# Patient Record
Sex: Female | Born: 1985
Health system: Southern US, Community
[De-identification: ages and names within clinical notes are randomized; demographics above are authoritative.]

## PROBLEM LIST (undated history)

## (undated) DIAGNOSIS — B977 Papillomavirus as the cause of diseases classified elsewhere: Secondary | ICD-10-CM

## (undated) HISTORY — DX: Papillomavirus as the cause of diseases classified elsewhere: B97.7

## (undated) HISTORY — PX: DILATION AND CURETTAGE OF UTERUS: SHX78

## (undated) HISTORY — PX: CERVICAL BIOPSY  W/ LOOP ELECTRODE EXCISION: SUR135

---

## 2006-09-26 ENCOUNTER — Inpatient Hospital Stay: Payer: Self-pay | Admitting: Obstetrics and Gynecology

## 2015-04-30 DIAGNOSIS — B977 Papillomavirus as the cause of diseases classified elsewhere: Secondary | ICD-10-CM

## 2015-04-30 HISTORY — DX: Papillomavirus as the cause of diseases classified elsewhere: B97.7

## 2015-04-30 LAB — HM PAP SMEAR

## 2016-11-10 ENCOUNTER — Encounter: Payer: Self-pay | Admitting: Certified Nurse Midwife

## 2016-11-10 ENCOUNTER — Ambulatory Visit (INDEPENDENT_AMBULATORY_CARE_PROVIDER_SITE_OTHER): Payer: 59 | Admitting: Certified Nurse Midwife

## 2016-11-10 VITALS — BP 112/85 | HR 101 | Ht 63.0 in | Wt 182.0 lb

## 2016-11-10 DIAGNOSIS — Z3041 Encounter for surveillance of contraceptive pills: Secondary | ICD-10-CM

## 2016-11-10 DIAGNOSIS — Z124 Encounter for screening for malignant neoplasm of cervix: Secondary | ICD-10-CM | POA: Diagnosis not present

## 2016-11-10 DIAGNOSIS — Z01419 Encounter for gynecological examination (general) (routine) without abnormal findings: Secondary | ICD-10-CM

## 2016-11-10 MED ORDER — NORGESTIM-ETH ESTRAD TRIPHASIC 0.18/0.215/0.25 MG-25 MCG PO TABS: 1.0000 | ORAL_TABLET | Freq: Every day | ORAL | 3 refills | Status: DC

## 2016-11-10 NOTE — Progress Notes (Signed)
ANNUAL PREVENTATIVE CARE GYN  ENCOUNTER NOTE  Subjective:       Ann Shepherd is a 31 y.o. [id number] female here for a routine annual gynecologic exam.    Denies difficulty breathing or respiratory distress, chest pain, abdominal pain, dysuria, unexplained vaginal bleeding, change in vaginal discharge, and leg pain or swelling.    Gynecologic History  Patient's last menstrual period was 10/26/2016 (exact date).  Contraception: OCP (estrogen/progesterone)  Last Pap: 04/2015. Results were: normal; HPV positive.   Menstrual History  Period Cycle (Days): 28 Period Duration (Days): 5-7 Period Pattern: Regular Menstrual Flow: Moderate Menstrual Control: Maxi pad Menstrual Control Change Freq (Hours): 1-2 hours on heavy days Dysmenorrhea: None  Obstetric History  OB History  Gravida Para Term Preterm AB Living  3 2 2   1 2   SAB TAB Ectopic Multiple Live Births  1       2    # Outcome Date GA Lbr Len/2nd Weight Sex Delivery Anes PTL Lv  3 Term 2012 [redacted]w[redacted]d  6 lb 3.2 oz (2.812 kg) F Vag-Spont   LIV  2 SAB 2010 [redacted]w[redacted]d       ND  1 Term 2008 [redacted]w[redacted]d  5 lb 9.6 oz (2.54 kg) F Vag-Spont  N LIV    Obstetric Comments  2010-sab & D&C    Past Medical History:  Diagnosis Date  . 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 DILATION AND CURETTAGE OF UTERUS      No current outpatient prescriptions on file prior to visit.   No current facility-administered medications on file prior to visit.     No Known Allergies  Social History   Social History  . Marital status: Single    Spouse name: N/A  . Number of children: N/A  . Years of education: N/A   Occupational History  . Not on file.   Social History Main Topics  . Smoking status: Never Smoker  . Smokeless tobacco: Never Used  . Alcohol use No  . Drug use: No  . Sexual activity: Yes    Partners: Male    Birth control/ protection: OCP   Other Topics Concern  . Not on file   Social  History Narrative  . No narrative on file    Family History  Problem Relation Age of Onset  . Diabetes Mother     The following portions of the patient's history were reviewed and updated as appropriate: allergies, current medications, past family history, past medical history, past social history, past surgical history and problem list.  Review of Systems  ROS negative expect as noted above. Information obtained from patient.    Objective:   BP 112/85   Pulse (!) 101   Ht 5\' 3"  (1.6 m)   Wt 182 lb (82.6 kg)   LMP 10/26/2016 (Exact Date)   BMI 32.24 kg/m   CONSTITUTIONAL: Well-developed, well-nourished female in no acute distress.   PSYCHIATRIC: Normal mood and affect. Normal behavior. Normal judgment and thought content.  NEUROLGIC: Alert and oriented to person, place, and time. Normal muscle tone coordination. No cranial nerve deficit noted.  HENT:  Normocephalic, atraumatic, External right and left ear normal. Oropharynx is clear and moist  EYES: Conjunctivae and EOM are normal. Pupils are equal, round, and reactive to light. No scleral icterus.   NECK: Normal range of motion, supple, no masses.  Normal thyroid.   SKIN: Skin is warm and dry. No rash noted. Not  diaphoretic. No erythema. No pallor.  CARDIOVASCULAR: Normal heart rate noted, regular rhythm, no murmur.  RESPIRATORY: Clear to auscultation bilaterally. Effort and breath sounds normal, no problems with respiration noted.  BREASTS: Symmetric in size. No masses, skin changes, nipple drainage, or lymphadenopathy.  ABDOMEN: Soft, normal bowel sounds, no distention noted.  No tenderness, rebound or guarding.   PELVIC:  External Genitalia: Normal  BUS: Normal  Vagina: Normal  Cervix: Normal  Uterus: Normal  Adnexa: Normal   MUSCULOSKELETAL: Normal range of motion. No tenderness.  No cyanosis, clubbing, or edema.  2+ distal pulses.  LYMPHATIC: No Axillary, Supraclavicular, or Inguinal  Adenopathy.  Assessment:   Annual gynecologic examination 31 y.o.   Contraception: OCP (estrogen/progesterone)   Obesity 1   Problem List Items Addressed This Visit    None    Visit Diagnoses    Well woman exam with routine gynecological exam    -  Primary   Relevant Orders   Pap IG and HPV (high risk) DNA detection   Screening for cervical cancer       Relevant Orders   Pap IG and HPV (high risk) DNA detection   Encounter for surveillance of contraceptive pills          Plan:   Pap: Pap Co Test.   Labs: Declined.   Routine preventative health maintenance measures emphasized: Exercise/Diet/Weight control, Tobacco Warnings, Alcohol/Substance use risks and Stress Management.   Rx Ortho Tri Cyclen Lo, see orders.   RTC x 1 year for annual exam or sooner if needed.    Gunnar BullaJenkins Michelle Nemesis Rainwater, CNM

## 2016-11-10 NOTE — Patient Instructions (Signed)
Ethinyl Estradiol; Norgestimate tablets What is this medicine? ETHINYL ESTRADIOL; NORGESTIMATE (ETH in il es tra DYE ole; nor JES ti mate) is an oral contraceptive. The products combine two types of female hormones, an estrogen and a progestin. They are used to prevent ovulation and pregnancy. Some products are also used to treat acne in females. This medicine may be used for other purposes; ask your health care provider or pharmacist if you have questions. COMMON BRAND NAME(S): Estarylla, MONO-LINYAH, MonoNessa, Norgestimate/Ethinyl Estradiol, Ortho Tri-Cyclen, Ortho Tri-Cyclen Lo, Ortho-Cyclen, Previfem, Sprintec, Tri-Estarylla, TRI-LINYAH, Tri-Lo-Estarylla, Tri-Lo-Marzia, Tri-Lo-Sprintec, Tri-Previfem, Tri-Sprintec, Tri-VyLibra, Trinessa, Minerva Areola What should I tell my health care provider before I take this medicine? They need to know if you have or ever had any of these conditions: -abnormal vaginal bleeding -blood vessel disease or blood clots -breast, cervical, endometrial, ovarian, liver, or uterine cancer -diabetes -gallbladder disease -heart disease or recent heart attack -high blood pressure -high cholesterol -kidney disease -liver disease -migraine headaches -stroke -systemic lupus erythematosus (SLE) -tobacco smoker -an unusual or allergic reaction to estrogens, progestins, other medicines, foods, dyes, or preservatives -pregnant or trying to get pregnant -breast-feeding How should I use this medicine? Take this medicine by mouth. To reduce nausea, this medicine may be taken with food. Follow the directions on the prescription label. Take this medicine at the same time each day and in the order directed on the package. Do not take your medicine more often than directed. Contact your pediatrician regarding the use of this medicine in children. Special care may be needed. This medicine has been used in female children who have started having menstrual periods. A  patient package insert for the product will be given with each prescription and refill. Read this sheet carefully each time. The sheet may change frequently. Overdosage: If you think you have taken too much of this medicine contact a poison control center or emergency room at once. NOTE: This medicine is only for you. Do not share this medicine with others. What if I miss a dose? If you miss a dose, refer to the patient information sheet you received with your medicine for direction. If you miss more than one pill, this medicine may not be as effective and you may need to use another form of birth control. What may interact with this medicine? Do not take this medicine with the following medication: -dasabuvir; ombitasvir; paritaprevir; ritonavir -ombitasvir; paritaprevir; ritonavir This medicine may also interact with the following medications: -acetaminophen -antibiotics or medicines for infections, especially rifampin, rifabutin, rifapentine, and griseofulvin, and possibly penicillins or tetracyclines -aprepitant -ascorbic acid (vitamin C) -atorvastatin -barbiturate medicines, such as phenobarbital -bosentan -carbamazepine -caffeine -clofibrate -cyclosporine -dantrolene -doxercalciferol -felbamate -grapefruit juice -hydrocortisone -medicines for anxiety or sleeping problems, such as diazepam or temazepam -medicines for diabetes, including pioglitazone -mineral oil -modafinil -mycophenolate -nefazodone -oxcarbazepine -phenytoin -prednisolone -ritonavir or other medicines for HIV infection or AIDS -rosuvastatin -selegiline -soy isoflavones supplements -St. John's wort -tamoxifen or raloxifene -theophylline -thyroid hormones -topiramate -warfarin This list may not describe all possible interactions. Give your health care provider a list of all the medicines, herbs, non-prescription drugs, or dietary supplements you use. Also tell them if you smoke, drink alcohol, or use  illegal drugs. Some items may interact with your medicine. What should I watch for while using this medicine? Visit your doctor or health care professional for regular checks on your progress. You will need a regular breast and pelvic exam and Pap smear while on this medicine. You should also discuss  the need for regular mammograms with your health care professional, and follow his or her guidelines for these tests. This medicine can make your body retain fluid, making your fingers, hands, or ankles swell. Your blood pressure can go up. Contact your doctor or health care professional if you feel you are retaining fluid. Use an additional method of contraception during the first cycle that you take these tablets. If you have any reason to think you are pregnant, stop taking this medicine right away and contact your doctor or health care professional. If you are taking this medicine for hormone related problems, it may take several cycles of use to see improvement in your condition. Do not use this product if you smoke and are over 14 years of age. Smoking increases the risk of getting a blood clot or having a stroke while you are taking birth control pills, especially if you are more than 32 years old. If you are a smoker who is 2 years of age or younger, you are strongly advised not to smoke while taking birth control pills. This medicine can make you more sensitive to the sun. Keep out of the sun. If you cannot avoid being in the sun, wear protective clothing and use sunscreen. Do not use sun lamps or tanning beds/booths. If you wear contact lenses and notice visual changes, or if the lenses begin to feel uncomfortable, consult your eye care specialist. In some women, tenderness, swelling, or minor bleeding of the gums may occur. Notify your dentist if this happens. Brushing and flossing your teeth regularly may help limit this. See your dentist regularly and inform your dentist of the medicines you are  taking. If you are going to have elective surgery, you may need to stop taking this medicine before the surgery. Consult your health care professional for advice. This medicine does not protect you against HIV infection (AIDS) or any other sexually transmitted diseases. What side effects may I notice from receiving this medicine? Side effects that you should report to your doctor or health care professional as soon as possible: -breast tissue changes or discharge -changes in vaginal bleeding during your period or between your periods -chest pain -coughing up blood -dizziness or fainting spells -headaches or migraines -leg, arm or groin pain -severe or sudden headaches -stomach pain (severe) -sudden shortness of breath -sudden loss of coordination, especially on one side of the body -speech problems -symptoms of vaginal infection like itching, irritation or unusual discharge -tenderness in the upper abdomen -vomiting -weakness or numbness in the arms or legs, especially on one side of the body -yellowing of the eyes or skin Side effects that usually do not require medical attention (report to your doctor or health care professional if they continue or are bothersome): -breakthrough bleeding and spotting that continues beyond the 3 initial cycles of pills -breast tenderness -mood changes, anxiety, depression, frustration, anger, or emotional outbursts -increased sensitivity to sun or ultraviolet light -nausea -skin rash, acne, or brown spots on the skin -weight gain (slight) This list may not describe all possible side effects. Call your doctor for medical advice about side effects. You may report side effects to FDA at 1-800-FDA-1088. Where should I keep my medicine? Keep out of the reach of children. Store at room temperature between 15 and 30 degrees C (59 and 86 degrees F). Throw away any unused medicine after the expiration date. NOTE: This sheet is a summary. It may not cover  all possible information. If you have questions  about this medicine, talk to your doctor, pharmacist, or health care provider.  2018 Elsevier/Gold Standard (2016-01-25 08:09:09) Preventive Care 18-39 Years, Female Preventive care refers to lifestyle choices and visits with your health care provider that can promote health and wellness. What does preventive care include?  A yearly physical exam. This is also called an annual well check.  Dental exams once or twice a year.  Routine eye exams. Ask your health care provider how often you should have your eyes checked.  Personal lifestyle choices, including: ? Daily care of your teeth and gums. ? Regular physical activity. ? Eating a healthy diet. ? Avoiding tobacco and drug use. ? Limiting alcohol use. ? Practicing safe sex. ? Taking vitamin and mineral supplements as recommended by your health care provider. What happens during an annual well check? The services and screenings done by your health care provider during your annual well check will depend on your age, overall health, lifestyle risk factors, and family history of disease. Counseling Your health care provider may ask you questions about your:  Alcohol use.  Tobacco use.  Drug use.  Emotional well-being.  Home and relationship well-being.  Sexual activity.  Eating habits.  Work and work Statistician.  Method of birth control.  Menstrual cycle.  Pregnancy history.  Screening You may have the following tests or measurements:  Height, weight, and BMI.  Diabetes screening. This is done by checking your blood sugar (glucose) after you have not eaten for a while (fasting).  Blood pressure.  Lipid and cholesterol levels. These may be checked every 5 years starting at age 67.  Skin check.  Hepatitis C blood test.  Hepatitis B blood test.  Sexually transmitted disease (STD) testing.  BRCA-related cancer screening. This may be done if you have a family  history of breast, ovarian, tubal, or peritoneal cancers.  Pelvic exam and Pap test. This may be done every 3 years starting at age 92. Starting at age 97, this may be done every 5 years if you have a Pap test in combination with an HPV test.  Discuss your test results, treatment options, and if necessary, the need for more tests with your health care provider. Vaccines Your health care provider may recommend certain vaccines, such as:  Influenza vaccine. This is recommended every year.  Tetanus, diphtheria, and acellular pertussis (Tdap, Td) vaccine. You may need a Td booster every 10 years.  Varicella vaccine. You may need this if you have not been vaccinated.  HPV vaccine. If you are 94 or younger, you may need three doses over 6 months.  Measles, mumps, and rubella (MMR) vaccine. You may need at least one dose of MMR. You may also need a second dose.  Pneumococcal 13-valent conjugate (PCV13) vaccine. You may need this if you have certain conditions and were not previously vaccinated.  Pneumococcal polysaccharide (PPSV23) vaccine. You may need one or two doses if you smoke cigarettes or if you have certain conditions.  Meningococcal vaccine. One dose is recommended if you are age 68-21 years and a first-year college student living in a residence hall, or if you have one of several medical conditions. You may also need additional booster doses.  Hepatitis A vaccine. You may need this if you have certain conditions or if you travel or work in places where you may be exposed to hepatitis A.  Hepatitis B vaccine. You may need this if you have certain conditions or if you travel or work in places where you  may be exposed to hepatitis B.  Haemophilus influenzae type b (Hib) vaccine. You may need this if you have certain risk factors.  Talk to your health care provider about which screenings and vaccines you need and how often you need them. This information is not intended to replace  advice given to you by your health care provider. Make sure you discuss any questions you have with your health care provider. Document Released: 07/12/2001 Document Revised: 02/03/2016 Document Reviewed: 03/17/2015 Elsevier Interactive Patient Education  2017 [employer].

## 2016-11-12 LAB — PAP IG AND HPV HIGH-RISK
HPV, HIGH-RISK: POSITIVE — AB
PAP SMEAR COMMENT: 0

## 2016-11-14 ENCOUNTER — Other Ambulatory Visit: Payer: Self-pay

## 2016-11-14 DIAGNOSIS — R87618 Other abnormal cytological findings on specimens from cervix uteri: Secondary | ICD-10-CM

## 2016-11-14 DIAGNOSIS — R8789 Other abnormal findings in specimens from female genital organs: Secondary | ICD-10-CM

## 2016-11-24 LAB — SPECIMEN STATUS REPORT

## 2016-11-24 LAB — HPV GENOTYPES 16/18,45
HPV GENOTYPE 18,45: NEGATIVE
HPV Genotype 16: NEGATIVE

## 2017-08-10 DIAGNOSIS — Z719 Counseling, unspecified: Secondary | ICD-10-CM | POA: Diagnosis not present

## 2017-08-21 DIAGNOSIS — Z719 Counseling, unspecified: Secondary | ICD-10-CM | POA: Diagnosis not present

## 2017-11-06 ENCOUNTER — Other Ambulatory Visit: Payer: Self-pay | Admitting: Certified Nurse Midwife

## 2017-11-13 ENCOUNTER — Other Ambulatory Visit: Payer: Self-pay

## 2017-11-13 ENCOUNTER — Encounter: Payer: Self-pay | Admitting: Certified Nurse Midwife

## 2017-11-13 ENCOUNTER — Telehealth: Payer: Self-pay | Admitting: Certified Nurse Midwife

## 2017-11-13 NOTE — Telephone Encounter (Signed)
The patient called and stated that she needs a medication refill. Please advise.

## 2017-11-14 ENCOUNTER — Encounter: Payer: 59 | Admitting: Certified Nurse Midwife

## 2017-11-16 ENCOUNTER — Other Ambulatory Visit: Payer: Self-pay

## 2017-11-16 MED ORDER — NORGESTIM-ETH ESTRAD TRIPHASIC 0.18/0.215/0.25 MG-25 MCG PO TABS: 1.0000 | ORAL_TABLET | Freq: Every day | ORAL | 0 refills | Status: DC

## 2017-11-24 ENCOUNTER — Encounter: Payer: Self-pay | Admitting: Certified Nurse Midwife

## 2017-11-24 ENCOUNTER — Ambulatory Visit (INDEPENDENT_AMBULATORY_CARE_PROVIDER_SITE_OTHER): Payer: 59 | Admitting: Certified Nurse Midwife

## 2017-11-24 ENCOUNTER — Other Ambulatory Visit: Payer: Self-pay

## 2017-11-24 VITALS — BP 115/82 | HR 108 | Ht 63.0 in | Wt 177.2 lb

## 2017-11-24 DIAGNOSIS — Z124 Encounter for screening for malignant neoplasm of cervix: Secondary | ICD-10-CM | POA: Diagnosis not present

## 2017-11-24 DIAGNOSIS — Z6831 Body mass index (BMI) 31.0-31.9, adult: Secondary | ICD-10-CM | POA: Diagnosis not present

## 2017-11-24 DIAGNOSIS — Z01419 Encounter for gynecological examination (general) (routine) without abnormal findings: Secondary | ICD-10-CM | POA: Diagnosis not present

## 2017-11-24 MED ORDER — NORGESTIM-ETH ESTRAD TRIPHASIC 0.18/0.215/0.25 MG-25 MCG PO TABS: 1.0000 | ORAL_TABLET | Freq: Every day | ORAL | 4 refills | Status: DC

## 2017-11-24 NOTE — Progress Notes (Signed)
Pt is here for an annual physical. LPS 2018 WNL.

## 2017-11-24 NOTE — Progress Notes (Signed)
ANNUAL PREVENTATIVE CARE GYN  ENCOUNTER NOTE  Subjective:       Ann Shepherd is a 32 y.o. [id number] female here for a routine annual gynecologic exam.  Current complaints: 1. Needs labs 2. Needs Pap  Denies difficulty breathing or respiratory distress, chest pain, abdominal pain, excessive vaginal bleeding, dysuria, and leg pain or sweeling.    Gynecologic History  Patient's last menstrual period was 11/13/2017 (exact date).  Contraception: OCP (estrogen/progesterone)  Last Pap: 10/2016. Results were: Negative/Positive HPV  Obstetric History  OB History  Gravida Para Term Preterm AB Living  3 2 2   1 2   SAB TAB Ectopic Multiple Live Births  1       2    # Outcome Date GA Lbr Len/2nd Weight Sex Delivery Anes PTL Lv  3 Term 2012 101w0d  6 lb 3.2 oz (2.812 kg) F Vag-Spont   LIV  2 SAB 2010 [redacted]w[redacted]d       ND  1 Term 2008 [redacted]w[redacted]d  5 lb 9.6 oz (2.54 kg) F Vag-Spont  N LIV    Obstetric Comments  2010-sab & D&C    Past Medical History:  Diagnosis Date  . 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 DILATION AND CURETTAGE OF UTERUS      No current outpatient medications on file prior to visit.   No current facility-administered medications on file prior to visit.     No Known Allergies  Social History   Socioeconomic History  . Marital status: Single    Spouse name: Not on file  . Number of children: Not on file  . Years of education: Not on file  . Highest education level: Not on file  Occupational History  . Not on file  Social Needs  . Financial resource strain: Not on file  . Food insecurity:    Worry: Not on file    Inability: Not on file  . Transportation needs:    Medical: Not on file    Non-medical: Not on file  Tobacco Use  . Smoking status: Never Smoker  . Smokeless tobacco: Never Used  Substance and Sexual Activity  . Alcohol use: No  . Drug use: No  . Sexual activity: Yes    Partners: Male    Birth  control/protection: Pill  Lifestyle  . Physical activity:    Days per week: Not on file    Minutes per session: Not on file  . Stress: Not on file  Relationships  . Social connections:    Talks on phone: Not on file    Gets together: Not on file    Attends religious service: Not on file    Active member of club or organization: Not on file    Attends meetings of clubs or organizations: Not on file    Relationship status: Not on file  . Intimate partner violence:    Fear of current or ex partner: Not on file    Emotionally abused: Not on file    Physically abused: Not on file    Forced sexual activity: Not on file  Other Topics Concern  . Not on file  Social History Narrative  . Not on file    Family History  Problem Relation Age of Onset  . Diabetes Mother     The following portions of the patient's history were reviewed and updated as appropriate: allergies, current medications, past family history, past medical history, past social history,  past surgical history and problem list.  Review of Systems  ROS negative except as noted above. Information obtained from patient.    Objective:   BP 115/82   Pulse (!) 108   Ht 5\' 3"  (1.6 m)   Wt 177 lb 4 oz (80.4 kg)   LMP 11/13/2017 (Exact Date)   BMI 31.40 kg/m    CONSTITUTIONAL: Well-developed, well-nourished female in no acute distress.   PSYCHIATRIC: Normal mood and affect. Normal behavior. Normal judgment and thought content.  NEUROLGIC: Alert and oriented to person, place, and time. Normal muscle tone coordination. No cranial nerve deficit noted.  HENT:  Normocephalic, atraumatic, External right and left ear normal.   EYES: Conjunctivae and EOM are normal. Pupils are equal and round.   NECK: Normal range of motion, supple, no masses.  Normal thyroid.   SKIN: Skin is warm and dry. No rash noted. Not diaphoretic. No erythema. No pallor.  CARDIOVASCULAR: Normal heart rate noted, regular rhythm, no  murmur.  RESPIRATORY: Clear to auscultation bilaterally. Effort and breath sounds normal, no problems with respiration noted.  BREASTS: Symmetric in size. No masses, skin changes, nipple drainage, or lymphadenopathy.  ABDOMEN: Soft, normal bowel sounds, no distention noted.  No tenderness, rebound or guarding. Obese.  PELVIC:  External Genitalia: Normal  Vagina: Normal  Cervix: Normal  Uterus: Normal  Adnexa: Normal  MUSCULOSKELETAL: Normal range of motion. No tenderness.  No cyanosis, clubbing, or edema.  2+ distal pulses.  LYMPHATIC: No Axillary, Supraclavicular, or Inguinal Adenopathy.  Assessment:   Annual gynecologic examination 32 y.o.   Contraception: OCP (estrogen/progesterone)   Obesity 1   Problem List Items Addressed This Visit    None    Visit Diagnoses    Well woman exam    -  Primary   Relevant Orders   PapIG, HPV, rfx 16/18   CBC   Thyroid Panel With TSH   Comprehensive metabolic panel   Lipid panel   Hemoglobin A1c   Screening for cervical cancer       Relevant Orders   PapIG, HPV, rfx 16/18   BMI 31.0-31.9,adult       Relevant Orders   Thyroid Panel With TSH   Comprehensive metabolic panel   Lipid panel   Hemoglobin A1c      Plan:   Pap: Pap Co Test  Labs: See orders  Routine preventative health maintenance measures emphasized: Exercise/Diet/Weight control, Tobacco Warnings, Alcohol/Substance use risks and Stress Management; see AVS   Rx: Ortho Tricyclen Lo, see orders  Reviewed red flag symptoms and when to call  RTC x 1 year for Annual Exam or sooner if needed   Gunnar BullaJenkins Michelle Octavius Shin, CNM [facility]

## 2017-11-24 NOTE — Patient Instructions (Addendum)
Preventive Care 18-39 Years, Female Preventive care refers to lifestyle choices and visits with your health care provider that can promote health and wellness. What does preventive care include?  A yearly physical exam. This is also called an annual well check.  Dental exams once or twice a year.  Routine eye exams. Ask your health care provider how often you should have your eyes checked.  Personal lifestyle choices, including: ? Daily care of your teeth and gums. ? Regular physical activity. ? Eating a healthy diet. ? Avoiding tobacco and drug use. ? Limiting alcohol use. ? Practicing safe sex. ? Taking vitamin and mineral supplements as recommended by your health care provider. What happens during an annual well check? The services and screenings done by your health care provider during your annual well check will depend on your age, overall health, lifestyle risk factors, and family history of disease. Counseling Your health care provider may ask you questions about your:  Alcohol use.  Tobacco use.  Drug use.  Emotional well-being.  Home and relationship well-being.  Sexual activity.  Eating habits.  Work and work Statistician.  Method of birth control.  Menstrual cycle.  Pregnancy history.  Screening You may have the following tests or measurements:  Height, weight, and BMI.  Diabetes screening. This is done by checking your blood sugar (glucose) after you have not eaten for a while (fasting).  Blood pressure.  Lipid and cholesterol levels. These may be checked every 5 years starting at age 66.  Skin check.  Hepatitis C blood test.  Hepatitis B blood test.  Sexually transmitted disease (STD) testing.  BRCA-related cancer screening. This may be done if you have a family history of breast, ovarian, tubal, or peritoneal cancers.  Pelvic exam and Pap test. This may be done every 3 years starting at age 40. Starting at age 59, this may be done every 5  years if you have a Pap test in combination with an HPV test.  Discuss your test results, treatment options, and if necessary, the need for more tests with your health care provider. Vaccines Your health care provider may recommend certain vaccines, such as:  Influenza vaccine. This is recommended every year.  Tetanus, diphtheria, and acellular pertussis (Tdap, Td) vaccine. You may need a Td booster every 10 years.  Varicella vaccine. You may need this if you have not been vaccinated.  HPV vaccine. If you are 69 or younger, you may need three doses over 6 months.  Measles, mumps, and rubella (MMR) vaccine. You may need at least one dose of MMR. You may also need a second dose.  Pneumococcal 13-valent conjugate (PCV13) vaccine. You may need this if you have certain conditions and were not previously vaccinated.  Pneumococcal polysaccharide (PPSV23) vaccine. You may need one or two doses if you smoke cigarettes or if you have certain conditions.  Meningococcal vaccine. One dose is recommended if you are age 27-21 years and a first-year college student living in a residence hall, or if you have one of several medical conditions. You may also need additional booster doses.  Hepatitis A vaccine. You may need this if you have certain conditions or if you travel or work in places where you may be exposed to hepatitis A.  Hepatitis B vaccine. You may need this if you have certain conditions or if you travel or work in places where you may be exposed to hepatitis B.  Haemophilus influenzae type b (Hib) vaccine. You may need this if  you have certain risk factors.  Talk to your health care provider about which screenings and vaccines you need and how often you need them. This information is not intended to replace advice given to you by your health care provider. Make sure you discuss any questions you have with your health care provider. Document Released: 07/12/2001 Document Revised: 02/03/2016  Document Reviewed: 03/17/2015 Elsevier Interactive Patient Education  2018 [employer]. Ethinyl Estradiol; Norgestimate tablets What is this medicine? ETHINYL ESTRADIOL; NORGESTIMATE (ETH in il es tra DYE ole; nor JES ti mate) is an oral contraceptive. The products combine two types of female hormones, an estrogen and a progestin. They are used to prevent ovulation and pregnancy. Some products are also used to treat acne in females. This medicine may be used for other purposes; ask your health care provider or pharmacist if you have questions. COMMON BRAND NAME(S): Estarylla, MONO-LINYAH, MonoNessa, Norgestimate/Ethinyl Estradiol, Ortho Tri-Cyclen, Ortho Tri-Cyclen Lo, Ortho-Cyclen, Previfem, Sprintec, Tri-Estarylla, TRI-LINYAH, Tri-Lo-Estarylla, Tri-Lo-Marzia, Tri-Lo-Sprintec, Tri-Previfem, Tri-Sprintec, Tri-VyLibra, Trinessa, Minerva Areola What should I tell my health care provider before I take this medicine? They need to know if you have or ever had any of these conditions: -abnormal vaginal bleeding -blood vessel disease or blood clots -breast, cervical, endometrial, ovarian, liver, or uterine cancer -diabetes -gallbladder disease -heart disease or recent heart attack -high blood pressure -high cholesterol -kidney disease -liver disease -migraine headaches -stroke -systemic lupus erythematosus (SLE) -tobacco smoker -an unusual or allergic reaction to estrogens, progestins, other medicines, foods, dyes, or preservatives -pregnant or trying to get pregnant -breast-feeding How should I use this medicine? Take this medicine by mouth. To reduce nausea, this medicine may be taken with food. Follow the directions on the prescription label. Take this medicine at the same time each day and in the order directed on the package. Do not take your medicine more often than directed. Contact your pediatrician regarding the use of this medicine in children. Special care may be needed. This  medicine has been used in female children who have started having menstrual periods. A patient package insert for the product will be given with each prescription and refill. Read this sheet carefully each time. The sheet may change frequently. Overdosage: If you think you have taken too much of this medicine contact a poison control center or emergency room at once. NOTE: This medicine is only for you. Do not share this medicine with others. What if I miss a dose? If you miss a dose, refer to the patient information sheet you received with your medicine for direction. If you miss more than one pill, this medicine may not be as effective and you may need to use another form of birth control. What may interact with this medicine? Do not take this medicine with the following medication: -dasabuvir; ombitasvir; paritaprevir; ritonavir -ombitasvir; paritaprevir; ritonavir This medicine may also interact with the following medications: -acetaminophen -antibiotics or medicines for infections, especially rifampin, rifabutin, rifapentine, and griseofulvin, and possibly penicillins or tetracyclines -aprepitant -ascorbic acid (vitamin C) -atorvastatin -barbiturate medicines, such as phenobarbital -bosentan -carbamazepine -caffeine -clofibrate -cyclosporine -dantrolene -doxercalciferol -felbamate -grapefruit juice -hydrocortisone -medicines for anxiety or sleeping problems, such as diazepam or temazepam -medicines for diabetes, including pioglitazone -mineral oil -modafinil -mycophenolate -nefazodone -oxcarbazepine -phenytoin -prednisolone -ritonavir or other medicines for HIV infection or AIDS -rosuvastatin -selegiline -soy isoflavones supplements -St. John's wort -tamoxifen or raloxifene -theophylline -thyroid hormones -topiramate -warfarin This list may not describe all possible interactions. Give your health care provider a list of all  the medicines, herbs, non-prescription  drugs, or dietary supplements you use. Also tell them if you smoke, drink alcohol, or use illegal drugs. Some items may interact with your medicine. What should I watch for while using this medicine? Visit your doctor or health care professional for regular checks on your progress. You will need a regular breast and pelvic exam and Pap smear while on this medicine. You should also discuss the need for regular mammograms with your health care professional, and follow his or her guidelines for these tests. This medicine can make your body retain fluid, making your fingers, hands, or ankles swell. Your blood pressure can go up. Contact your doctor or health care professional if you feel you are retaining fluid. Use an additional method of contraception during the first cycle that you take these tablets. If you have any reason to think you are pregnant, stop taking this medicine right away and contact your doctor or health care professional. If you are taking this medicine for hormone related problems, it may take several cycles of use to see improvement in your condition. Do not use this product if you smoke and are over 56 years of age. Smoking increases the risk of getting a blood clot or having a stroke while you are taking birth control pills, especially if you are more than 33 years old. If you are a smoker who is 19 years of age or younger, you are strongly advised not to smoke while taking birth control pills. This medicine can make you more sensitive to the sun. Keep out of the sun. If you cannot avoid being in the sun, wear protective clothing and use sunscreen. Do not use sun lamps or tanning beds/booths. If you wear contact lenses and notice visual changes, or if the lenses begin to feel uncomfortable, consult your eye care specialist. In some women, tenderness, swelling, or minor bleeding of the gums may occur. Notify your dentist if this happens. Brushing and flossing your teeth regularly may  help limit this. See your dentist regularly and inform your dentist of the medicines you are taking. If you are going to have elective surgery, you may need to stop taking this medicine before the surgery. Consult your health care professional for advice. This medicine does not protect you against HIV infection (AIDS) or any other sexually transmitted diseases. What side effects may I notice from receiving this medicine? Side effects that you should report to your doctor or health care professional as soon as possible: -breast tissue changes or discharge -changes in vaginal bleeding during your period or between your periods -chest pain -coughing up blood -dizziness or fainting spells -headaches or migraines -leg, arm or groin pain -severe or sudden headaches -stomach pain (severe) -sudden shortness of breath -sudden loss of coordination, especially on one side of the body -speech problems -symptoms of vaginal infection like itching, irritation or unusual discharge -tenderness in the upper abdomen -vomiting -weakness or numbness in the arms or legs, especially on one side of the body -yellowing of the eyes or skin Side effects that usually do not require medical attention (report to your doctor or health care professional if they continue or are bothersome): -breakthrough bleeding and spotting that continues beyond the 3 initial cycles of pills -breast tenderness -mood changes, anxiety, depression, frustration, anger, or emotional outbursts -increased sensitivity to sun or ultraviolet light -nausea -skin rash, acne, or brown spots on the skin -weight gain (slight) This list may not describe all possible side effects. Call  your doctor for medical advice about side effects. You may report side effects to FDA at 1-800-FDA-1088. Where should I keep my medicine? Keep out of the reach of children. Store at room temperature between 15 and 30 degrees C (59 and 86 degrees F). Throw away any  unused medicine after the expiration date. NOTE: This sheet is a summary. It may not cover all possible information. If you have questions about this medicine, talk to your doctor, pharmacist, or health care provider.  2018 Elsevier/Gold Standard (2016-01-25 08:09:09)

## 2017-11-25 LAB — THYROID PANEL WITH TSH
FREE THYROXINE INDEX: 1.7 (ref 1.2–4.9)
T3 Uptake Ratio: 21 % — ABNORMAL LOW (ref 24–39)
T4 TOTAL: 8.3 ug/dL (ref 4.5–12.0)
TSH: 2.14 u[IU]/mL (ref 0.450–4.500)

## 2017-11-25 LAB — CBC
HEMATOCRIT: 38.2 % (ref 34.0–46.6)
Hemoglobin: 13.2 g/dL (ref 11.1–15.9)
MCH: 31.1 pg (ref 26.6–33.0)
MCHC: 34.6 g/dL (ref 31.5–35.7)
MCV: 90 fL (ref 79–97)
Platelets: 276 10*3/uL (ref 150–450)
RBC: 4.25 x10E6/uL (ref 3.77–5.28)
RDW: 13.9 % (ref 12.3–15.4)
WBC: 6.5 10*3/uL (ref 3.4–10.8)

## 2017-11-25 LAB — COMPREHENSIVE METABOLIC PANEL
ALBUMIN: 4.3 g/dL (ref 3.5–5.5)
ALK PHOS: 97 IU/L (ref 39–117)
ALT: 12 IU/L (ref 0–32)
AST: 13 IU/L (ref 0–40)
Albumin/Globulin Ratio: 1.5 (ref 1.2–2.2)
BILIRUBIN TOTAL: 0.3 mg/dL (ref 0.0–1.2)
BUN / CREAT RATIO: 11 (ref 9–23)
BUN: 8 mg/dL (ref 6–20)
CHLORIDE: 102 mmol/L (ref 96–106)
CO2: 20 mmol/L (ref 20–29)
CREATININE: 0.76 mg/dL (ref 0.57–1.00)
Calcium: 9.4 mg/dL (ref 8.7–10.2)
GFR calc Af Amer: 119 mL/min/{1.73_m2} (ref >59)
GFR calc non Af Amer: 103 mL/min/{1.73_m2} (ref >59)
GLUCOSE: 87 mg/dL (ref 65–99)
Globulin, Total: 2.9 g/dL (ref 1.5–4.5)
Potassium: 3.7 mmol/L (ref 3.5–5.2)
Sodium: 139 mmol/L (ref 134–144)
Total Protein: 7.2 g/dL (ref 6.0–8.5)

## 2017-11-25 LAB — LIPID PANEL
CHOLESTEROL TOTAL: 162 mg/dL (ref 100–199)
Chol/HDL Ratio: 3.5 ratio (ref 0.0–4.4)
HDL: 46 mg/dL (ref >39)
LDL CALC: 92 mg/dL (ref 0–99)
TRIGLYCERIDES: 118 mg/dL (ref 0–149)
VLDL CHOLESTEROL CAL: 24 mg/dL (ref 5–40)

## 2017-11-25 LAB — HEMOGLOBIN A1C
Est. average glucose Bld gHb Est-mCnc: 111 mg/dL
Hgb A1c MFr Bld: 5.5 % (ref 4.8–5.6)

## 2017-11-27 DIAGNOSIS — Z6831 Body mass index (BMI) 31.0-31.9, adult: Secondary | ICD-10-CM | POA: Insufficient documentation

## 2017-11-27 DIAGNOSIS — Z6835 Body mass index (BMI) 35.0-35.9, adult: Secondary | ICD-10-CM | POA: Insufficient documentation

## 2017-12-05 LAB — PAPIG, HPV, RFX 16/18
HPV Genotype, 16: NEGATIVE
HPV Genotype, 18: NEGATIVE
HPV, HIGH-RISK: POSITIVE — AB
PAP SMEAR COMMENT: 0

## 2018-05-21 DIAGNOSIS — J101 Influenza due to other identified influenza virus with other respiratory manifestations: Secondary | ICD-10-CM | POA: Diagnosis not present

## 2018-05-21 DIAGNOSIS — R6889 Other general symptoms and signs: Secondary | ICD-10-CM | POA: Diagnosis not present

## 2018-11-24 ENCOUNTER — Other Ambulatory Visit: Payer: Self-pay | Admitting: Family Medicine

## 2018-11-24 DIAGNOSIS — Z20822 Contact with and (suspected) exposure to covid-19: Secondary | ICD-10-CM

## 2018-11-26 ENCOUNTER — Encounter: Payer: 59 | Admitting: Certified Nurse Midwife

## 2018-11-26 ENCOUNTER — Other Ambulatory Visit: Payer: Self-pay

## 2018-11-26 ENCOUNTER — Telehealth: Payer: Self-pay | Admitting: Certified Nurse Midwife

## 2018-11-26 MED ORDER — NORGESTIM-ETH ESTRAD TRIPHASIC 0.18/0.215/0.25 MG-25 MCG PO TABS: 1.0000 | ORAL_TABLET | Freq: Every day | ORAL | 0 refills | Status: DC

## 2018-11-26 NOTE — Telephone Encounter (Signed)
The patient called and stated that she has to cancel her apt due too her having to get tested for covid-19 the patient is requesting a refill of her birth control until she is able to come in. Please advise.

## 2018-11-30 LAB — NOVEL CORONAVIRUS, NAA: SARS-CoV-2, NAA: NOT DETECTED

## 2018-12-11 ENCOUNTER — Other Ambulatory Visit: Payer: Self-pay | Admitting: Certified Nurse Midwife

## 2019-03-03 ENCOUNTER — Other Ambulatory Visit: Payer: Self-pay | Admitting: Certified Nurse Midwife

## 2019-03-15 ENCOUNTER — Ambulatory Visit (INDEPENDENT_AMBULATORY_CARE_PROVIDER_SITE_OTHER): Payer: BC Managed Care – PPO | Admitting: Certified Nurse Midwife

## 2019-03-15 ENCOUNTER — Other Ambulatory Visit (HOSPITAL_COMMUNITY)
Admission: RE | Admit: 2019-03-15 | Discharge: 2019-03-15 | Disposition: A | Discharge status: Home / Self Care | Payer: BC Managed Care – PPO | Source: Ambulatory Visit | Attending: Certified Nurse Midwife | Admitting: Certified Nurse Midwife

## 2019-03-15 ENCOUNTER — Encounter: Payer: Self-pay | Admitting: Certified Nurse Midwife

## 2019-03-15 ENCOUNTER — Other Ambulatory Visit: Payer: Self-pay

## 2019-03-15 VITALS — BP 120/76 | HR 96 | Ht 64.0 in | Wt 196.7 lb

## 2019-03-15 DIAGNOSIS — Z124 Encounter for screening for malignant neoplasm of cervix: Secondary | ICD-10-CM

## 2019-03-15 DIAGNOSIS — N921 Excessive and frequent menstruation with irregular cycle: Secondary | ICD-10-CM | POA: Insufficient documentation

## 2019-03-15 DIAGNOSIS — Z3041 Encounter for surveillance of contraceptive pills: Secondary | ICD-10-CM | POA: Insufficient documentation

## 2019-03-15 DIAGNOSIS — Z01419 Encounter for gynecological examination (general) (routine) without abnormal findings: Secondary | ICD-10-CM

## 2019-03-15 DIAGNOSIS — R8789 Other abnormal findings in specimens from female genital organs: Secondary | ICD-10-CM | POA: Insufficient documentation

## 2019-03-15 DIAGNOSIS — R87618 Other abnormal cytological findings on specimens from cervix uteri: Secondary | ICD-10-CM

## 2019-03-15 MED ORDER — NORGESTIM-ETH ESTRAD TRIPHASIC 0.18/0.215/0.25 MG-25 MCG PO TABS: 1.0000 | ORAL_TABLET | Freq: Every day | ORAL | 4 refills | Status: DC

## 2019-03-15 NOTE — Progress Notes (Signed)
ANNUAL PREVENTATIVE CARE GYN  ENCOUNTER NOTE  Subjective:       Ann Shepherd is a 33 y.o. G61P2012 female here for a routine annual gynecologic exam.  Current complaints: 1. Breakthrough bleeding every morning for the last four (4) months-not skipping pills, taking at the same time each day  Denies difficulty breathing or respiratory distress, chest pain, abdominal pain, excessive vaginal bleeding, dysuria, and leg pain.    Gynecologic History  Patient's last menstrual period was 02/15/2019 (exact date). Period Cycle (Days): 28 Period Duration (Days): 4 Period Pattern: Regular Menstrual Flow: Moderate Menstrual Control: Thin pad Dysmenorrhea: None  Contraception: OCP (estrogen/progesterone), Tri Lo Marzia  Last Pap: 10/2017. Results were: Negative/HPV+/Negative 16, 18  Obstetric History  OB History  Gravida Para Term Preterm AB Living  3 2 2   1 2   SAB TAB Ectopic Multiple Live Births  1       2    # Outcome Date GA Lbr Len/2nd Weight Sex Delivery Anes PTL Lv  3 Term 12/01/10 [redacted]w[redacted]d  6 lb 3.2 oz (2.812 kg) F Vag-Spont  N LIV  2 SAB 2010 [redacted]w[redacted]d       ND  1 Term 09/26/06 [redacted]w[redacted]d  5 lb 9.6 oz (2.54 kg) F Vag-Spont None N LIV    Obstetric Comments  2010-sab & D&C    Past Medical History:  Diagnosis Date  . 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 DILATION AND CURETTAGE OF UTERUS      Current Outpatient Medications on File Prior to Visit  Medication Sig Dispense Refill  . TRI-LO-MARZIA 0.18/0.215/0.25 MG-25 MCG tab TAKE 1 TABLET BY MOUTH EVERY DAY 84 tablet 0   No current facility-administered medications on file prior to visit.     No Known Allergies  Social History   Socioeconomic History  . Marital status: Single    Spouse name: Not on file  . Number of children: Not on file  . Years of education: Not on file  . Highest education level: Not on file  Occupational History  . Not on file  Social Needs  . Financial  resource strain: Not on file  . Food insecurity    Worry: Not on file    Inability: Not on file  . Transportation needs    Medical: Not on file    Non-medical: Not on file  Tobacco Use  . Smoking status: Never Smoker  . Smokeless tobacco: Never Used  Substance and Sexual Activity  . Alcohol use: No  . Drug use: No  . Sexual activity: Yes    Partners: Male    Birth control/protection: Pill  Lifestyle  . Physical activity    Days per week: Not on file    Minutes per session: Not on file  . Stress: Not on file  Relationships  . Social 06-27-1981 on phone: Not on file    Gets together: Not on file    Attends religious service: Not on file    Active member of club or organization: Not on file    Attends meetings of clubs or organizations: Not on file    Relationship status: Not on file  . Intimate partner violence    Fear of current or ex partner: Not on file    Emotionally abused: Not on file    Physically abused: Not on file    Forced sexual activity: Not on file  Other Topics Concern  .  Not on file  Social History Narrative  . Not on file    Family History  Problem Relation Age of Onset  . Diabetes Mother   . Breast cancer Neg Hx   . Ovarian cancer Neg Hx   . Colon cancer Neg Hx     The following portions of the patient's history were reviewed and updated as appropriate: allergies, current medications, past family history, past medical history, past social history, past surgical history and problem list.  Review of Systems  ROS negative except as noted above. Information obtained from patient.    Objective:   BP 120/76   Pulse 96   Ht 5\' 4"  (1.626 m)   Wt 196 lb 11.2 oz (89.2 kg)   LMP 02/15/2019 (Exact Date)   BMI 33.76 kg/m    CONSTITUTIONAL: Well-developed, well-nourished female in no acute distress.   PSYCHIATRIC: Normal mood and affect. Normal behavior. Normal judgment and thought content.  [place]: Alert and oriented to person,  place, and time. Normal muscle tone coordination. No cranial nerve deficit noted.  HENT:  Normocephalic, atraumatic, External right and left ear normal.   EYES: Conjunctivae and EOM are normal. Pupils are equal and round.   NECK: Normal range of motion, supple, no masses.  Normal thyroid.   SKIN: Skin is warm and dry. No rash noted. Not diaphoretic. No erythema. No pallor.  CARDIOVASCULAR: Normal heart rate noted, regular rhythm, no murmur.  RESPIRATORY: Clear to auscultation bilaterally. Effort and breath sounds normal, no problems with respiration noted.  BREASTS: Symmetric in size. No masses, skin changes, nipple drainage, or lymphadenopathy.  ABDOMEN: Soft, normal bowel sounds, no distention noted.  No tenderness, rebound or guarding.   PELVIC:  External Genitalia: Normal  BUS: Normal  Vagina: Normal  Cervix: Normal  Uterus: Normal  Adnexa: Normal  MUSCULOSKELETAL: Normal range of motion. No tenderness.  No cyanosis, clubbing, or edema.  2+ distal pulses.  LYMPHATIC: No Axillary, Supraclavicular, or Inguinal Adenopathy.  Assessment:   Annual gynecologic examination 33 y.o.   Contraception: OCP (estrogen/progesterone), Tri Lo Marzia   Obesity 1   Problem List Items Addressed This Visit    None    Visit Diagnoses    Well woman exam    -  Primary   Relevant Orders   Cervicovaginal ancillary only   Cytology - PAP   Surveillance for birth control, oral contraceptives       Relevant Orders   Cervicovaginal ancillary only   Pap smear abnormality of cervix/human papillomavirus (HPV) positive       Relevant Orders   Cytology - PAP   Screening for cervical cancer       Relevant Orders   Cytology - PAP   Breakthrough bleeding on OCPs       Relevant Orders   Cervicovaginal ancillary only      Plan:   Pap: Pap Co Test; discussed need for colposcopy if results are still the same  Labs: Vaginal swab collected, will contact patient with results  Routine  preventative health maintenance measures emphasized: Exercise/Diet/Weight control, Tobacco Warnings, Alcohol/Substance use risks, Stress Management and Peer Pressure Issues; see AVS  Refill of OCPs, see orders  Reviewed red flag symptoms and when to call  RTC x 1 year for ANNUAL EXAM or sooner if needed   Diona Fanti, CNM [facility], [facility] 03/15/19 4:01 PM

## 2019-03-15 NOTE — Patient Instructions (Signed)
Colposcopy Colposcopy is a procedure to examine the lowest part of the uterus (cervix), the vagina, and the area around the vaginal opening (vulva) for abnormalities or signs of disease. The procedure is done using a lighted microscope or magnifying lens (colposcope). If any unusual cells are found during the procedure, your health care provider may remove a tissue sample for testing (biopsy). A colposcopy may be done if you:  Have an abnormal Pap test. A Pap test is a screening test that is used to check for signs of cancer or infection of the vagina, cervix, and uterus.  Have a Pap smear test in which you test positive for high-risk HPV (human papillomavirus).  Have a sore or lesion on your cervix.  Have genital warts on your vulva, vagina, or cervix.  Took certain medicines while pregnant, such as diethylstilbestrol (DES).  Have pain during sexual intercourse.  Have vaginal bleeding, especially after sexual intercourse.  Need to have a cervical polyp removed.  Need to have a lost intrauterine device (IUD) string located. Let your health care provider know about:  Any allergies you have, including allergies to prescribed medicine, latex, or iodine.  All medicines you are taking, including vitamins, herbs, eye drops, creams, and over-the-counter medicines. Bring a list of all of your medicines to your appointment.  Any problems you or family members have had with anesthetic medicines.  Any blood disorders you have.  Any surgeries you have had.  Any medical conditions you have, such as pelvic inflammatory disease (PID) or endometrial disorder.  Any history of frequent fainting.  Your menstrual cycle and what form of birth control (contraception) you use.  Your medical history, including any prior cervical treatment.  Whether you are pregnant or may be pregnant. What are the risks? Generally, this is a safe procedure. However, problems may occur,  including:  Pain.  Infection, which may include a fever, bad-smelling discharge, or pelvic pain.  Bleeding or discharge.  Misdiagnosis.  Fainting and vasovagal reactions, but this is rare.  Allergic reactions to medicines.  Damage to other structures or organs. What happens before the procedure?  If you have your menstrual period or will have it at the time of your procedure, tell your health care provider. A colposcopy typically is not done during menstruation.  Continue your contraceptive practices before and after the procedure.  For 24 hours before the colposcopy: ? Do not douche. ? Do not use tampons. ? Do not use medicines, creams, or suppositories in the vagina. ? Do not have sexual intercourse.  Ask your health care provider about: ? Changing or stopping your regular medicines. This is especially important if you are taking diabetes medicines or blood thinners. ? Taking medicines such as aspirin and ibuprofen. These medicines can thin your blood. Do not take these medicines before your procedure if your health care provider instructs you not to. It is likely that your health care provider will tell you to avoid taking aspirin or medicine that contains aspirin for 7 days before the procedure.  Follow instructions from your health care provider about eating or drinking restrictions. You will likely need to eat a regular diet the day of the procedure and not skip any meals.  You may have an exam or testing. A pregnancy test will be taken on the day of the procedure.  You may have a blood or urine sample taken.  Plan to have someone take you home from the hospital or clinic.  If you will be going  home right after the procedure, plan to have someone with you for 24 hours. What happens during the procedure?  You will lie down on your back, with your feet in foot rests (stirrups).  A warmed and lubricated instrument (speculum) will be inserted into your vagina. The  speculum will be used to hold apart the walls of your vagina so your health care provider can see your cervix and the inside of your vagina.  A cotton swab will be used to place a small amount of liquid solution on the areas to be examined. This solution makes it easier to see abnormal cells. You may feel a slight burning during this part.  The colposcope will be used to scan the cervix with a bright white light. The colposcope will be held near your vulvaand will magnify your vulva, vagina, and cervix for easier examination.  Your health care provider may decide to take a biopsy. If so: ? You may be given medicine to numb the area (local anesthetic). ? Surgical instruments will be used to suck out mucus and cells through your vagina. ? You may feel mild pain while the tissue sample is removed. ? Bleeding may occur. A solution may be used to stop the bleeding. ? If a sample of tissue is needed from the inside of the cervix, a different procedure called endocervical curettage (ECC) may be completed. During this procedure, a curved instrument (curette) will be used to scrape cells from your cervix or the top of your cervix (endocervix).  Your health care provider will record the location of any abnormalities. The procedure may vary among health care providers and hospitals. What happens after the procedure?  You will lie down and rest for a few minutes. You may be offered juice or cookies.  Your blood pressure, heart rate, breathing rate, and blood oxygen level will be monitored until any medicines you were given have worn off.  You may have to wear compression stockings. These stockings help to prevent blood clots and reduce swelling in your legs.  You may have some cramping in your abdomen. This should go away after a few minutes. This information is not intended to replace advice given to you by your health care provider. Make sure you discuss any questions you have with your health care  provider. Document Released: 08/06/2002 Document Revised: 04/28/2017 Document Reviewed: 12/21/2015 Elsevier Patient Education  [place].   HPV Test Why am I having this test? HPV (human papillomavirus) refers to a group of about 100 viruses. Many of these viruses cause growths on, in, or around the genitals. Most HPV viruses cause infections that usually go away without treatment.  The HPV test checks for high-risk types (strains) of HPV. Strains 16 and 18 are considered the most high-risk for cancer. If you have strain 16 or 18 HPV and it is not treated, it can increase your risk for cancer of the cervix, vagina, vulva, or anus. HPV can be found in both males and females. However, the HPV test is used to screen for increased cancer risk in females:  Who are 83-59 years old.  Who have an abnormal Pap test.  Who have been treated for an abnormal Pap test in the past.  Who have been treated for a high-risk HPV infection in the past. If you are a woman older than 30, you may have the HPV test at the same time as a pelvic exam and Pap test. What is being tested? This test checks  for the DNA (genetic) strands of the HPV infection. This test is also called the HPV DNA test. What kind of sample is taken?  This test requires a sample of cells from the cervix. This will be done using a small cotton swab, plastic spatula, or brush. This sample is often collected during a pelvic exam, when you are lying on your back on an exam table with feet in footrests (stirrups). How do I prepare for this test?  Starting 24-48 hours before your test, or as told by your health care provider, do not: ? Take a bath. ? Have sex. ? Douche.  Schedule the test for a day when you are not menstruating. If you are menstruating on the day of the test, you may need to reschedule.  You will be asked to urinate right before the test. How are the results reported? Your test results will be reported as either  positive or negative for HPV. If you have a positive result, the results will indicate which HPV strain you are positive for. What do the results mean? A negative HPV test result means that no HPV was found. This means it is very likely that you do not have HPV. A positive HPV test result means that you have HPV.  If your results show the presence of any high-risk strains, you may have a higher risk of developing anal or cervical cancer if your infection is not treated.  If any low-risk HPV strains are found, it is not likely that you have an increased risk for cancer. Talk with your health care provider about what your results mean. Questions to ask your health care provider Ask your health care provider, or the department that is doing the test:  When will my results be ready?  How will I get my results?  What are my treatment options?  What other tests do I need?  What are my next steps? Summary  The human papillomavirus (HPV) test is used to look for high-risk types of HPV infection. This test is done only for females.  HPV types 16 and 18 are considered high-risk types of HPV. If untreated, these types of infections increase your risk for cancer of the cervix or anus.  A negative HPV test result means that no HPV was found, and it is very likely that you do not have HPV.  A positive HPV test result means that you have an HPV infection. This information is not intended to replace advice given to you by your health care provider. Make sure you discuss any questions you have with your health care provider. Document Released: 06/10/2004 Document Revised: 04/28/2017 Document Reviewed: 03/28/2017 Elsevier Patient Education  [place].   Ethinyl Estradiol; Norgestimate tablets What is this medicine? ETHINYL ESTRADIOL; NORGESTIMATE (ETH in il es tra DYE ole; nor JES ti mate) is an oral contraceptive. The products combine two types of female hormones, an estrogen and a  progestin. They are used to prevent ovulation and pregnancy. Some products are also used to treat acne in females. This medicine may be used for other purposes; ask your health care provider or pharmacist if you have questions. COMMON BRAND NAME(S): Estarylla, Mili, MONO-LINYAH, MonoNessa, Norgestimate/Ethinyl Estradiol, Ortho Tri-Cyclen, Ortho Tri-Cyclen Lo, Ortho-Cyclen, Previfem, Sprintec, Tri-Estarylla, TRI-LINYAH, Tri-Lo-Estarylla, Tri-Lo-Marzia, Tri-Lo-Mili, Tri-Lo-Sprintec, Tri-Mili, Tri-Previfem, Tri-Sprintec, Tri-VyLibra, Trinessa, Trinessa Lo, VyLibra What should I tell my health care provider before I take this medicine? They need to know if you have or ever had any of these  conditions:  abnormal vaginal bleeding  blood vessel disease or blood clots  breast, cervical, endometrial, ovarian, liver, or uterine cancer  diabetes  gallbladder disease  heart disease or recent heart attack  high blood pressure  high cholesterol  kidney disease  liver disease  migraine headaches  stroke  systemic lupus erythematosus (SLE)  tobacco smoker  an unusual or allergic reaction to estrogens, progestins, other medicines, foods, dyes, or preservatives  pregnant or trying to get pregnant  breast-feeding How should I use this medicine? Take this medicine by mouth. To reduce nausea, this medicine may be taken with food. Follow the directions on the prescription label. Take this medicine at the same time each day and in the order directed on the package. Do not take your medicine more often than directed. Contact your pediatrician regarding the use of this medicine in children. Special care may be needed. This medicine has been used in female children who have started having menstrual periods. A patient package insert for the product will be given with each prescription and refill. Read this sheet carefully each time. The sheet may change frequently. Overdosage: If you think you have  taken too much of this medicine contact a poison control center or emergency room at once. NOTE: This medicine is only for you. Do not share this medicine with others. What if I miss a dose? If you miss a dose, refer to the patient information sheet you received with your medicine for direction. If you miss more than one pill, this medicine may not be as effective and you may need to use another form of birth control. What may interact with this medicine? Do not take this medicine with the following medication:  dasabuvir; ombitasvir; paritaprevir; ritonavir  ombitasvir; paritaprevir; ritonavir This medicine may also interact with the following medications:  acetaminophen  antibiotics or medicines for infections, especially rifampin, rifabutin, rifapentine, and griseofulvin, and possibly penicillins or tetracyclines  aprepitant  ascorbic acid (vitamin C)  atorvastatin  barbiturate medicines, such as phenobarbital  bosentan  carbamazepine  caffeine  clofibrate  cyclosporine  dantrolene  doxercalciferol  felbamate  grapefruit juice  hydrocortisone  medicines for anxiety or sleeping problems, such as diazepam or temazepam  medicines for diabetes, including pioglitazone  mineral oil  modafinil  mycophenolate  nefazodone  oxcarbazepine  phenytoin  prednisolone  ritonavir or other medicines for HIV infection or AIDS  rosuvastatin  selegiline  soy isoflavones supplements  St. John's wort  tamoxifen or raloxifene  theophylline  thyroid hormones  topiramate  warfarin This list may not describe all possible interactions. Give your health care provider a list of all the medicines, herbs, non-prescription drugs, or dietary supplements you use. Also tell them if you smoke, drink alcohol, or use illegal drugs. Some items may interact with your medicine. What should I watch for while using this medicine? Visit your doctor or health care professional  for regular checks on your progress. You will need a regular breast and pelvic exam and Pap smear while on this medicine. You should also discuss the need for regular mammograms with your health care professional, and follow his or her guidelines for these tests. This medicine can make your body retain fluid, making your fingers, hands, or ankles swell. Your blood pressure can go up. Contact your doctor or health care professional if you feel you are retaining fluid. Use an additional method of contraception during the first cycle that you take these tablets. If you have any reason to think  you are pregnant, stop taking this medicine right away and contact your doctor or health care professional. If you are taking this medicine for hormone related problems, it may take several cycles of use to see improvement in your condition. Do not use this product if you smoke and are over 52 years of age. Smoking increases the risk of getting a blood clot or having a stroke while you are taking birth control pills, especially if you are more than 34 years old. If you are a smoker who is 23 years of age or younger, you are strongly advised not to smoke while taking birth control pills. This medicine can make you more sensitive to the sun. Keep out of the sun. If you cannot avoid being in the sun, wear protective clothing and use sunscreen. Do not use sun lamps or tanning beds/booths. If you wear contact lenses and notice visual changes, or if the lenses begin to feel uncomfortable, consult your eye care specialist. In some women, tenderness, swelling, or minor bleeding of the gums may occur. Notify your dentist if this happens. Brushing and flossing your teeth regularly may help limit this. See your dentist regularly and inform your dentist of the medicines you are taking. If you are going to have elective surgery, you may need to stop taking this medicine before the surgery. Consult your health care professional for  advice. This medicine does not protect you against HIV infection (AIDS) or any other sexually transmitted diseases. What side effects may I notice from receiving this medicine? Side effects that you should report to your doctor or health care professional as soon as possible:  breast tissue changes or discharge  changes in vaginal bleeding during your period or between your periods  chest pain  coughing up blood  dizziness or fainting spells  headaches or migraines  leg, arm or groin pain  severe or sudden headaches  stomach pain (severe)  sudden shortness of breath  sudden loss of coordination, especially on one side of the body  speech problems  symptoms of vaginal infection like itching, irritation or unusual discharge  tenderness in the upper abdomen  vomiting  weakness or numbness in the arms or legs, especially on one side of the body  yellowing of the eyes or skin Side effects that usually do not require medical attention (report to your doctor or health care professional if they continue or are bothersome):  breakthrough bleeding and spotting that continues beyond the 3 initial cycles of pills  breast tenderness  mood changes, anxiety, depression, frustration, anger, or emotional outbursts  increased sensitivity to sun or ultraviolet light  nausea  skin rash, acne, or brown spots on the skin  weight gain (slight) This list may not describe all possible side effects. Call your doctor for medical advice about side effects. You may report side effects to FDA at 1-800-FDA-1088. Where should I keep my medicine? Keep out of the reach of children. Store at room temperature between 15 and 30 degrees C (59 and 86 degrees F). Throw away any unused medicine after the expiration date. NOTE: This sheet is a summary. It may not cover all possible information. If you have questions about this medicine, talk to your doctor, pharmacist, or health care provider.  2020  Elsevier/Gold Standard (2016-01-25 08:09:09)   Preventive Care 66-11 Years Old, Female Preventive care refers to visits with your health care provider and lifestyle choices that can promote health and wellness. This includes:  A yearly physical exam.  This may also be called an annual well check.  Regular dental visits and eye exams.  Immunizations.  Screening for certain conditions.  Healthy lifestyle choices, such as eating a healthy diet, getting regular exercise, not using drugs or products that contain nicotine and tobacco, and limiting alcohol use. What can I expect for my preventive care visit? Physical exam Your health care provider will check your:  Height and weight. This may be used to calculate body mass index (BMI), which tells if you are at a healthy weight.  Heart rate and blood pressure.  Skin for abnormal spots. Counseling Your health care provider may ask you questions about your:  Alcohol, tobacco, and drug use.  Emotional well-being.  Home and relationship well-being.  Sexual activity.  Eating habits.  Work and work Statistician.  Method of birth control.  Menstrual cycle.  Pregnancy history. What immunizations do I need?  Influenza (flu) vaccine  This is recommended every year. Tetanus, diphtheria, and pertussis (Tdap) vaccine  You may need a Td booster every 10 years. Varicella (chickenpox) vaccine  You may need this if you have not been vaccinated. Human papillomavirus (HPV) vaccine  If recommended by your health care provider, you may need three doses over 6 months. Measles, mumps, and rubella (MMR) vaccine  You may need at least one dose of MMR. You may also need a second dose. Meningococcal conjugate (MenACWY) vaccine  One dose is recommended if you are age 28-21 years and a first-year college student living in a residence hall, or if you have one of several medical conditions. You may also need additional booster  doses. Pneumococcal conjugate (PCV13) vaccine  You may need this if you have certain conditions and were not previously vaccinated. Pneumococcal polysaccharide (PPSV23) vaccine  You may need one or two doses if you smoke cigarettes or if you have certain conditions. Hepatitis A vaccine  You may need this if you have certain conditions or if you travel or work in places where you may be exposed to hepatitis A. Hepatitis B vaccine  You may need this if you have certain conditions or if you travel or work in places where you may be exposed to hepatitis B. Haemophilus influenzae type b (Hib) vaccine  You may need this if you have certain conditions. You may receive vaccines as individual doses or as more than one vaccine together in one shot (combination vaccines). Talk with your health care provider about the risks and benefits of combination vaccines. What tests do I need?  Blood tests  Lipid and cholesterol levels. These may be checked every 5 years starting at age 38.  Hepatitis C test.  Hepatitis B test. Screening  Diabetes screening. This is done by checking your blood sugar (glucose) after you have not eaten for a while (fasting).  Sexually transmitted disease (STD) testing.  BRCA-related cancer screening. This may be done if you have a family history of breast, ovarian, tubal, or peritoneal cancers.  Pelvic exam and Pap test. This may be done every 3 years starting at age 67. Starting at age 23, this may be done every 5 years if you have a Pap test in combination with an HPV test. Talk with your health care provider about your test results, treatment options, and if necessary, the need for more tests. Follow these instructions at home: Eating and drinking   Eat a diet that includes fresh fruits and vegetables, whole grains, lean protein, and low-fat dairy.  Take vitamin and mineral supplements  as recommended by your health care provider.  Do not drink alcohol  if: ? Your health care provider tells you not to drink. ? You are pregnant, may be pregnant, or are planning to become pregnant.  If you drink alcohol: ? Limit how much you have to 0-1 drink a day. ? Be aware of how much alcohol is in your drink. In the [place] one drink equals one 12 oz bottle of beer (355 mL), one 5 oz glass of wine (148 mL), or one 1 oz glass of hard liquor (44 mL). Lifestyle  Take daily care of your teeth and gums.  Stay active. Exercise for at least 30 minutes on 5 or more days each week.  Do not use any products that contain nicotine or tobacco, such as cigarettes, e-cigarettes, and chewing tobacco. If you need help quitting, ask your health care provider.  If you are sexually active, practice safe sex. Use a condom or other form of birth control (contraception) in order to prevent pregnancy and STIs (sexually transmitted infections). If you plan to become pregnant, see your health care provider for a preconception visit. What's next?  Visit your health care provider once a year for a well check visit.  Ask your health care provider how often you should have your eyes and teeth checked.  Stay up to date on all vaccines. This information is not intended to replace advice given to you by your health care provider. Make sure you discuss any questions you have with your health care provider. Document Released: 07/12/2001 Document Revised: 01/25/2018 Document Reviewed: 01/25/2018 Elsevier Patient Education  2020 [employer].

## 2019-03-15 NOTE — Progress Notes (Signed)
Patient here for annul exam.  Patient c/o BTB x4 months, usually mid cycle, denies skipping OCP and takes at same time every day.

## 2019-03-19 LAB — CERVICOVAGINAL ANCILLARY ONLY
Bacterial Vaginitis (gardnerella): NEGATIVE
Candida Glabrata: NEGATIVE
Candida Vaginitis: NEGATIVE
Comment: NEGATIVE
Comment: NEGATIVE
Comment: NEGATIVE
Comment: NEGATIVE
Trichomonas: NEGATIVE

## 2019-03-22 LAB — CYTOLOGY - PAP
Comment: NEGATIVE
Comment: NEGATIVE
Comment: NEGATIVE
Diagnosis: NEGATIVE
HPV 16: NEGATIVE
HPV 18 / 45: NEGATIVE
High risk HPV: POSITIVE — AB

## 2020-03-16 ENCOUNTER — Encounter: Payer: Self-pay | Admitting: Certified Nurse Midwife

## 2020-03-16 ENCOUNTER — Ambulatory Visit (INDEPENDENT_AMBULATORY_CARE_PROVIDER_SITE_OTHER): Payer: BC Managed Care – PPO | Admitting: Certified Nurse Midwife

## 2020-03-16 ENCOUNTER — Other Ambulatory Visit: Payer: Self-pay

## 2020-03-16 ENCOUNTER — Other Ambulatory Visit (HOSPITAL_COMMUNITY)
Admission: RE | Admit: 2020-03-16 | Discharge: 2020-03-16 | Disposition: A | Discharge status: Home / Self Care | Payer: BC Managed Care – PPO | Source: Ambulatory Visit | Attending: Certified Nurse Midwife | Admitting: Certified Nurse Midwife

## 2020-03-16 VITALS — BP 128/87 | HR 90 | Ht 64.0 in | Wt 209.6 lb

## 2020-03-16 DIAGNOSIS — R87618 Other abnormal cytological findings on specimens from cervix uteri: Secondary | ICD-10-CM | POA: Insufficient documentation

## 2020-03-16 DIAGNOSIS — Z01419 Encounter for gynecological examination (general) (routine) without abnormal findings: Secondary | ICD-10-CM | POA: Diagnosis not present

## 2020-03-16 DIAGNOSIS — Z124 Encounter for screening for malignant neoplasm of cervix: Secondary | ICD-10-CM | POA: Insufficient documentation

## 2020-03-16 DIAGNOSIS — Z3041 Encounter for surveillance of contraceptive pills: Secondary | ICD-10-CM | POA: Diagnosis not present

## 2020-03-16 DIAGNOSIS — Z6835 Body mass index (BMI) 35.0-35.9, adult: Secondary | ICD-10-CM

## 2020-03-16 MED ORDER — NORGESTIM-ETH ESTRAD TRIPHASIC 0.18/0.215/0.25 MG-25 MCG PO TABS: 1.0000 | ORAL_TABLET | Freq: Every day | ORAL | 4 refills | Status: DC

## 2020-03-16 NOTE — Patient Instructions (Addendum)
Pap Test Why am I having this test? A Pap test, also called a Pap smear, is a screening test to check for signs of:  Cancer of the vagina, cervix, and uterus. The cervix is the lower part of the uterus that opens into the vagina.  Infection.  Changes that may be a sign that cancer is developing (precancerous changes). Women need this test on a regular basis. In general, you should have a Pap test every 3 years until you reach menopause or age 35. Women aged 30-60 may choose to have their Pap test done at the same time as an HPV (human papillomavirus) test every 5 years (instead of every 3 years). Your health care provider may recommend having Pap tests more or less often depending on your medical conditions and past Pap test results. What kind of sample is taken?  Your health care provider will collect a sample of cells from the surface of your cervix. This will be done using a small cotton swab, plastic spatula, or brush. This sample is often collected during a pelvic exam, when you are lying on your back on an exam table with feet in footrests (stirrups). In some cases, fluids (secretions) from the cervix or vagina may also be collected. How do I prepare for this test?  Be aware of where you are in your menstrual cycle. If you are menstruating on the day of the test, you may be asked to reschedule.  You may need to reschedule if you have a known vaginal infection on the day of the test.  Follow instructions from your health care provider about: ? Changing or stopping your regular medicines. Some medicines can cause abnormal test results, such as digitalis and tetracycline. ? Avoiding douching or taking a bath the day before or the day of the test. Tell a health care provider about:  Any allergies you have.  All medicines you are taking, including vitamins, herbs, eye drops, creams, and over-the-counter medicines.  Any blood disorders you have.  Any surgeries you have had.  Any  medical conditions you have.  Whether you are pregnant or may be pregnant. How are the results reported? Your test results will be reported as either abnormal or normal. A false-positive result can occur. A false positive is incorrect because it means that a condition is present when it is not. A false-negative result can occur. A false negative is incorrect because it means that a condition is not present when it is. What do the results mean? A normal test result means that you do not have signs of cancer of the vagina, cervix, or uterus. An abnormal result may mean that you have:  Cancer. A Pap test by itself is not enough to diagnose cancer. You will have more tests done in this case.  Precancerous changes in your vagina, cervix, or uterus.  Inflammation of the cervix.  An STD (sexually transmitted disease).  A fungal infection.  A parasite infection. Talk with your health care provider about what your results mean. Questions to ask your health care provider Ask your health care provider, or the department that is doing the test:  When will my results be ready?  How will I get my results?  What are my treatment options?  What other tests do I need?  What are my next steps? Summary  In general, women should have a Pap test every 3 years until they reach menopause or age 35.  Your health care provider will collect a   sample of cells from the surface of your cervix. This will be done using a small cotton swab, plastic spatula, or brush.  In some cases, fluids (secretions) from the cervix or vagina may also be collected. This information is not intended to replace advice given to you by your health care provider. Make sure you discuss any questions you have with your health care provider. Document Revised: 01/23/2017 Document Reviewed: 01/23/2017 Elsevier Patient Education  2020 Elsevier Inc. Preventive Care 21-39 Years Old, Female Preventive care refers to visits with  your health care provider and lifestyle choices that can promote health and wellness. This includes:  A yearly physical exam. This may also be called an annual well check.  Regular dental visits and eye exams.  Immunizations.  Screening for certain conditions.  Healthy lifestyle choices, such as eating a healthy diet, getting regular exercise, not using drugs or products that contain nicotine and tobacco, and limiting alcohol use. What can I expect for my preventive care visit? Physical exam Your health care provider will check your:  Height and weight. This may be used to calculate body mass index (BMI), which tells if you are at a healthy weight.  Heart rate and blood pressure.  Skin for abnormal spots. Counseling Your health care provider may ask you questions about your:  Alcohol, tobacco, and drug use.  Emotional well-being.  Home and relationship well-being.  Sexual activity.  Eating habits.  Work and work environment.  Method of birth control.  Menstrual cycle.  Pregnancy history. What immunizations do I need?  Influenza (flu) vaccine  This is recommended every year. Tetanus, diphtheria, and pertussis (Tdap) vaccine  You may need a Td booster every 10 years. Varicella (chickenpox) vaccine  You may need this if you have not been vaccinated. Human papillomavirus (HPV) vaccine  If recommended by your health care provider, you may need three doses over 6 months. Measles, mumps, and rubella (MMR) vaccine  You may need at least one dose of MMR. You may also need a second dose. Meningococcal conjugate (MenACWY) vaccine  One dose is recommended if you are age 19-21 years and a first-year college student living in a residence hall, or if you have one of several medical conditions. You may also need additional booster doses. Pneumococcal conjugate (PCV13) vaccine  You may need this if you have certain conditions and were not previously  vaccinated. Pneumococcal polysaccharide (PPSV23) vaccine  You may need one or two doses if you smoke cigarettes or if you have certain conditions. Hepatitis A vaccine  You may need this if you have certain conditions or if you travel or work in places where you may be exposed to hepatitis A. Hepatitis B vaccine  You may need this if you have certain conditions or if you travel or work in places where you may be exposed to hepatitis B. Haemophilus influenzae type b (Hib) vaccine  You may need this if you have certain conditions. You may receive vaccines as individual doses or as more than one vaccine together in one shot (combination vaccines). Talk with your health care provider about the risks and benefits of combination vaccines. What tests do I need?  Blood tests  Lipid and cholesterol levels. These may be checked every 5 years starting at age 20.  Hepatitis C test.  Hepatitis B test. Screening  Diabetes screening. This is done by checking your blood sugar (glucose) after you have not eaten for a while (fasting).  Sexually transmitted disease (STD) testing.    BRCA-related cancer screening. This may be done if you have a family history of breast, ovarian, tubal, or peritoneal cancers.  Pelvic exam and Pap test. This may be done every 3 years starting at age 21. Starting at age 30, this may be done every 5 years if you have a Pap test in combination with an HPV test. Talk with your health care provider about your test results, treatment options, and if necessary, the need for more tests. Follow these instructions at home: Eating and drinking   Eat a diet that includes fresh fruits and vegetables, whole grains, lean protein, and low-fat dairy.  Take vitamin and mineral supplements as recommended by your health care provider.  Do not drink alcohol if: ? Your health care provider tells you not to drink. ? You are pregnant, may be pregnant, or are planning to become  pregnant.  If you drink alcohol: ? Limit how much you have to 0-1 drink a day. ? Be aware of how much alcohol is in your drink. In the [place] one drink equals one 12 oz bottle of beer (355 mL), one 5 oz glass of wine (148 mL), or one 1 oz glass of hard liquor (44 mL). Lifestyle  Take daily care of your teeth and gums.  Stay active. Exercise for at least 30 minutes on 5 or more days each week.  Do not use any products that contain nicotine or tobacco, such as cigarettes, e-cigarettes, and chewing tobacco. If you need help quitting, ask your health care provider.  If you are sexually active, practice safe sex. Use a condom or other form of birth control (contraception) in order to prevent pregnancy and STIs (sexually transmitted infections). If you plan to become pregnant, see your health care provider for a preconception visit. What's next?  Visit your health care provider once a year for a well check visit.  Ask your health care provider how often you should have your eyes and teeth checked.  Stay up to date on all vaccines. This information is not intended to replace advice given to you by your health care provider. Make sure you discuss any questions you have with your health care provider. Document Revised: 01/25/2018 Document Reviewed: 01/25/2018 Elsevier Patient Education  2020 Elsevier Inc.  

## 2020-03-16 NOTE — Progress Notes (Signed)
ANNUAL PREVENTATIVE CARE GYN  ENCOUNTER NOTE  Subjective:       Ann Shepherd is a 34 y.o. G25P2012 female here for a routine annual gynecologic exam.  Current complaints: 1. Weight gain 2. Needs Pap smear-office never called to schedule colposcopy last year  Denies difficulty breathing or respiratory distress, chest pain, abdominal pain, dysuria, excessive vaginal bleeding, dysuria, and leg pain or swelling.    Gynecologic History  Patient's last menstrual period was 02/21/2020 (exact date). Period Cycle (Days): 28 Period Duration (Days): 3-4 Period Pattern: Regular Menstrual Flow: Light, Moderate Menstrual Control: Thin pad Menstrual Control Change Freq (Hours): 2-3 Dysmenorrhea: None  Contraception: OCP (estrogen/progesterone)  Last Pap: 02/2019. Results were: abnormal, Negative/Positive HRHPV, negative 16/18  Obstetric History  OB History  Gravida Para Term Preterm AB Living  3 2 2   1 2   SAB TAB Ectopic Multiple Live Births  1       2    # Outcome Date GA Lbr Len/2nd Weight Sex Delivery Anes PTL Lv  3 Term 12/01/10 [redacted]w[redacted]d  6 lb 3.2 oz (2.812 kg) F Vag-Spont  N LIV  2 SAB 2010 [redacted]w[redacted]d       ND  1 Term 09/26/06 [redacted]w[redacted]d  5 lb 9.6 oz (2.54 kg) F Vag-Spont None N LIV    Obstetric Comments  2010-sab & D&C    Past Medical History:  Diagnosis Date  . 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 DILATION AND CURETTAGE OF UTERUS      Current Outpatient Medications on File Prior to Visit  Medication Sig Dispense Refill  . Norgestimate-Ethinyl Estradiol Triphasic (TRI-LO-MARZIA) 0.18/0.215/0.25 MG-25 MCG tab Take 1 tablet by mouth daily. 84 tablet 4   No current facility-administered medications on file prior to visit.    No Known Allergies  Social History   Socioeconomic History  . Marital status: Single    Spouse name: Not on file  . Number of children: Not on file  . Years of education: Not on file  . Highest education level:  Not on file  Occupational History  . Not on file  Tobacco Use  . Smoking status: Never Smoker  . Smokeless tobacco: Never Used  Vaping Use  . Vaping Use: Never used  Substance and Sexual Activity  . Alcohol use: No  . Drug use: No  . Sexual activity: Yes    Partners: Male    Birth control/protection: Pill  Other Topics Concern  . Not on file  Social History Narrative  . Not on file   Social Determinants of Health   Financial Resource Strain:   . Difficulty of Paying Living Expenses: Not on file  Food Insecurity:   . Worried About 06-27-1981 in the Last Year: Not on file  . Ran Out of Food in the Last Year: Not on file  Transportation Needs:   . Lack of Transportation (Medical): Not on file  . Lack of Transportation (Non-Medical): Not on file  Physical Activity:   . Days of Exercise per Week: Not on file  . Minutes of Exercise per Session: Not on file  Stress:   . Feeling of Stress : Not on file  Social Connections:   . Frequency of Communication with Friends and Family: Not on file  . Frequency of Social Gatherings with Friends and Family: Not on file  . Attends Religious Services: Not on file  . Active Member of Clubs or Organizations:  Not on file  . Attends Banker Meetings: Not on file  . Marital Status: Not on file  Intimate Partner Violence:   . Fear of Current or Ex-Partner: Not on file  . Emotionally Abused: Not on file  . Physically Abused: Not on file  . Sexually Abused: Not on file    Family History  Problem Relation Age of Onset  . Diabetes Mother   . Breast cancer Neg Hx   . Ovarian cancer Neg Hx   . Colon cancer Neg Hx     The following portions of the patient's history were reviewed and updated as appropriate: allergies, current medications, past family history, past medical history, past social history, past surgical history and problem list.  Review of Systems  ROS negative except as noted above. Information obtained  from patient.    Objective:   BP 128/87   Pulse 90   Ht 5\' 4"  (1.626 m)   Wt 209 lb 9.6 oz (95.1 kg)   LMP 02/21/2020 (Exact Date)   BMI 35.98 kg/m   CONSTITUTIONAL: Well-developed, well-nourished female in no acute distress.   PSYCHIATRIC: Normal mood and affect. Normal behavior. Normal judgment and thought content.  NEUROLGIC: Alert and oriented to person, place, and time. Normal muscle tone coordination. No cranial nerve deficit noted.  HENT:  Normocephalic, atraumatic, External right and left ear normal.   EYES: Conjunctivae and EOM are normal. Pupils are equal and round.   NECK: Normal range of motion, supple, no masses.  Normal thyroid.   SKIN: Skin is warm and dry. No rash noted. Not diaphoretic. No erythema. No pallor.  CARDIOVASCULAR: Normal heart rate noted, regular rhythm, no murmur.  RESPIRATORY: Clear to auscultation bilaterally. Effort and breath sounds normal, no problems with respiration noted.  BREASTS: Symmetric in size. No masses, skin changes, nipple drainage, or lymphadenopathy.  ABDOMEN: Soft, normal bowel sounds, no distention noted.  No tenderness, rebound or guarding.   PELVIC:  External Genitalia: Normal  Vagina: Normal  Cervix: Friable, Pap collected  Uterus: Normal  Adnexa: Normal  MUSCULOSKELETAL: Normal range of motion. No tenderness.  No cyanosis, clubbing, or edema.  2+ distal pulses.  LYMPHATIC: No Axillary, Supraclavicular, or Inguinal Adenopathy.  Assessment:   Annual gynecologic examination 34 y.o.   Contraception: OCP (estrogen/progesterone)   Obesity 2   Problem List Items Addressed This Visit    None    Visit Diagnoses    Well woman exam    -  Primary   Relevant Orders   CBC   Thyroid Panel With TSH   Hemoglobin A1c   Comprehensive metabolic panel   Lipid panel   VITAMIN D 25 Hydroxy (Vit-D Deficiency, Fractures)   Cytology - PAP   Surveillance for birth control, oral contraceptives       Pap smear abnormality  of cervix/human papillomavirus (HPV) positive       Relevant Orders   Cytology - PAP   Screening for cervical cancer       Relevant Orders   Cytology - PAP   BMI 35.0-35.9,adult       Relevant Orders   CBC   Thyroid Panel With TSH   Hemoglobin A1c   Comprehensive metabolic panel   Lipid panel   VITAMIN D 25 Hydroxy (Vit-D Deficiency, Fractures)      Plan:   Pap: Pap Co Test  Labs: See orders Routine preventative health maintenance measures emphasized: Exercise/Diet/Weight control, Tobacco Warnings, Alcohol/Substance use risks and Stress Management; see AVS  Rx OCP, see orders  Reviewed red flag symptoms and when to call  Return to Clinic - 1 Year for [facility] or sooner if needed  Serafina Royals, CNM  [facility], [facility] 03/16/20 3:56 PM

## 2020-03-20 ENCOUNTER — Other Ambulatory Visit: Payer: BC Managed Care – PPO

## 2020-03-20 ENCOUNTER — Other Ambulatory Visit: Payer: Self-pay

## 2020-03-20 LAB — CYTOLOGY - PAP
Comment: NEGATIVE
Comment: NEGATIVE
Diagnosis: HIGH — AB
HPV 16: NEGATIVE
HPV 18 / 45: NEGATIVE
High risk HPV: POSITIVE — AB

## 2020-03-21 ENCOUNTER — Encounter: Payer: Self-pay | Admitting: Certified Nurse Midwife

## 2020-03-21 ENCOUNTER — Other Ambulatory Visit: Payer: Self-pay | Admitting: Certified Nurse Midwife

## 2020-03-21 DIAGNOSIS — E7800 Pure hypercholesterolemia, unspecified: Secondary | ICD-10-CM | POA: Insufficient documentation

## 2020-03-21 DIAGNOSIS — R87611 Atypical squamous cells cannot exclude high grade squamous intraepithelial lesion on cytologic smear of cervix (ASC-H): Secondary | ICD-10-CM | POA: Insufficient documentation

## 2020-03-21 DIAGNOSIS — E559 Vitamin D deficiency, unspecified: Secondary | ICD-10-CM | POA: Insufficient documentation

## 2020-03-21 LAB — CBC
Hematocrit: 38.1 % (ref 34.0–46.6)
Hemoglobin: 12.6 g/dL (ref 11.1–15.9)
MCH: 30.1 pg (ref 26.6–33.0)
MCHC: 33.1 g/dL (ref 31.5–35.7)
MCV: 91 fL (ref 79–97)
Platelets: 287 10*3/uL (ref 150–450)
RBC: 4.19 x10E6/uL (ref 3.77–5.28)
RDW: 13.7 % (ref 11.7–15.4)
WBC: 7.1 10*3/uL (ref 3.4–10.8)

## 2020-03-21 LAB — COMPREHENSIVE METABOLIC PANEL
ALT: 18 IU/L (ref 0–32)
AST: 10 IU/L (ref 0–40)
Albumin/Globulin Ratio: 1.2 (ref 1.2–2.2)
Albumin: 3.8 g/dL (ref 3.8–4.8)
Alkaline Phosphatase: 88 IU/L (ref 44–121)
BUN/Creatinine Ratio: 16 (ref 9–23)
BUN: 10 mg/dL (ref 6–20)
Bilirubin Total: <0.2 mg/dL (ref 0.0–1.2)
CO2: 25 mmol/L (ref 20–29)
Calcium: 9 mg/dL (ref 8.7–10.2)
Chloride: 105 mmol/L (ref 96–106)
Creatinine, Ser: 0.63 mg/dL (ref 0.57–1.00)
GFR calc Af Amer: 134 mL/min/{1.73_m2} (ref >59)
GFR calc non Af Amer: 117 mL/min/{1.73_m2} (ref >59)
Globulin, Total: 3.2 g/dL (ref 1.5–4.5)
Glucose: 85 mg/dL (ref 65–99)
Potassium: 4.6 mmol/L (ref 3.5–5.2)
Sodium: 141 mmol/L (ref 134–144)
Total Protein: 7 g/dL (ref 6.0–8.5)

## 2020-03-21 LAB — LIPID PANEL
Chol/HDL Ratio: 4.2 ratio (ref 0.0–4.4)
Cholesterol, Total: 168 mg/dL (ref 100–199)
HDL: 40 mg/dL (ref >39)
LDL Chol Calc (NIH): 106 mg/dL — ABNORMAL HIGH (ref 0–99)
Triglycerides: 124 mg/dL (ref 0–149)
VLDL Cholesterol Cal: 22 mg/dL (ref 5–40)

## 2020-03-21 LAB — THYROID PANEL WITH TSH
Free Thyroxine Index: 1.9 (ref 1.2–4.9)
T3 Uptake Ratio: 22 % — ABNORMAL LOW (ref 24–39)
T4, Total: 8.6 ug/dL (ref 4.5–12.0)
TSH: 3.36 u[IU]/mL (ref 0.450–4.500)

## 2020-03-21 LAB — HEMOGLOBIN A1C
Est. average glucose Bld gHb Est-mCnc: 114 mg/dL
Hgb A1c MFr Bld: 5.6 % (ref 4.8–5.6)

## 2020-03-21 LAB — VITAMIN D 25 HYDROXY (VIT D DEFICIENCY, FRACTURES): Vit D, 25-Hydroxy: 8.2 ng/mL — ABNORMAL LOW (ref 30.0–100.0)

## 2020-03-21 MED ORDER — VITAMIN D (ERGOCALCIFEROL) 1.25 MG (50000 UNIT) PO CAPS: 50000.0000 [IU] | ORAL_CAPSULE | ORAL | 2 refills | Status: DC

## 2020-03-21 NOTE — Progress Notes (Signed)
Rx Vitamin D, see orders.    Ann Shepherd, CNM [facility], [facility] 03/21/20 11:41 AM

## 2020-05-08 ENCOUNTER — Other Ambulatory Visit: Payer: Self-pay

## 2020-05-08 ENCOUNTER — Ambulatory Visit (INDEPENDENT_AMBULATORY_CARE_PROVIDER_SITE_OTHER): Payer: BC Managed Care – PPO | Admitting: Certified Nurse Midwife

## 2020-05-08 ENCOUNTER — Encounter: Payer: Self-pay | Admitting: Obstetrics and Gynecology

## 2020-05-08 VITALS — BP 137/83 | HR 107 | Ht 64.0 in | Wt 219.4 lb

## 2020-05-08 DIAGNOSIS — R87611 Atypical squamous cells cannot exclude high grade squamous intraepithelial lesion on cytologic smear of cervix (ASC-H): Secondary | ICD-10-CM | POA: Diagnosis not present

## 2020-05-08 NOTE — Progress Notes (Signed)
Pt present for colpo. Pt stated that she was doing well no problems.  

## 2020-05-08 NOTE — Patient Instructions (Addendum)

## 2020-05-09 ENCOUNTER — Other Ambulatory Visit (HOSPITAL_COMMUNITY)
Admission: RE | Admit: 2020-05-09 | Discharge: 2020-05-09 | Disposition: A | Discharge status: Home / Self Care | Payer: BC Managed Care – PPO | Source: Ambulatory Visit | Attending: Obstetrics and Gynecology | Admitting: Obstetrics and Gynecology

## 2020-05-09 DIAGNOSIS — R87611 Atypical squamous cells cannot exclude high grade squamous intraepithelial lesion on cytologic smear of cervix (ASC-H): Secondary | ICD-10-CM | POA: Diagnosis not present

## 2020-05-09 NOTE — Progress Notes (Signed)
[facility] COLPOSCOPY PROCEDURE NOTE  34 y.o. [id number] here for colposcopy for ASC cannot exclude high grade lesion [facility]) pap smear on 03/16/2020. Discussed role for HPV in cervical dysplasia, need for surveillance.  Patient given informed consent, signed copy in the chart, time out was performed.  Placed in lithotomy position. Cervix viewed with speculum and colposcope after application of acetic acid.   Colposcopy adequate? Yes Acetowhite lesion(s) noted at 12 & 6 o'clock; corresponding biopsies obtained.   Ectropian noted, patient on oral contraceptives.  ECC specimen obtained. All specimens were labelled and sent to pathology.   Patient was given post procedure instructions.  Will follow up pathology and manage accordingly; patient will be contacted with results and recommendations.  Routine preventative health maintenance measures emphasized.  Dr. Valentino Saxon was present during procedure and agrees with findings.    Serafina Royals, CNM [facility], [facility] 05/09/20 9:03 AM

## 2020-05-13 LAB — SURGICAL PATHOLOGY

## 2020-05-14 ENCOUNTER — Telehealth: Payer: Self-pay

## 2020-05-14 NOTE — Telephone Encounter (Signed)
Called pt stating that I get a message from [facility] to schedule pt for discuss treatment options for CIN-3 with Cherry. Made the appt with the pt.

## 2020-06-02 ENCOUNTER — Other Ambulatory Visit: Payer: Self-pay

## 2020-06-02 ENCOUNTER — Encounter: Payer: Self-pay | Admitting: Obstetrics and Gynecology

## 2020-06-02 ENCOUNTER — Ambulatory Visit: Payer: BC Managed Care – PPO | Admitting: Obstetrics and Gynecology

## 2020-06-02 VITALS — BP 131/86 | HR 103 | Ht 64.0 in | Wt 221.5 lb

## 2020-06-02 DIAGNOSIS — D069 Carcinoma in situ of cervix, unspecified: Secondary | ICD-10-CM | POA: Insufficient documentation

## 2020-06-02 NOTE — Progress Notes (Signed)
Pt present to discuss treatment for abnormal pap smear. Pt stated that she would like to discuss other options of birth control due to her not wanting to take birth control for the rest of her life due to cycle issues.

## 2020-06-02 NOTE — Progress Notes (Signed)
GYNECOLOGY PROGRESS NOTE Subjective:    Patient ID: Ann Shepherd, female    DOB: 1985-07-31, 35 y.o.   MRN: [id number]  HPI  Patient is a 35 y.o. G27P2012 female who presents for discussion of management of cervical dysplasia.  She underwent a colposcopy with biopsies on 05/08/2020 for an ASC-H pap performed 03/16/2020. Biopsies noted CIN III.  Patient denies any major complaints today. Notes that she has had light spotting almost daily since her procedure, but just stopped yesterday.   Ann Shepherd also desires to discuss contraception. She notes that she is tired of taking a pill everyday.  Declines use of LARC (IUD or Nexplaon). Has also tried the patch in the past but notes it did not work well for her.  Notes that if she has to have a major surgery, she would prefer to have her "tubes tied" or have a hysterectomy.    The following portions of the patient's history were reviewed and updated as appropriate: allergies, current medications, past family history, past medical history, past social history, past surgical history and problem list.  Review of Systems Pertinent items noted in HPI and remainder of comprehensive ROS otherwise negative.   Objective:   Blood pressure 131/86, pulse (!) 103, height 5\' 4"  (1.626 m), weight 221 lb 8 oz (100.5 kg), last menstrual period 05/27/2020. General appearance: alert and no distress Remainder of exam deferred.    Labs:  Cytology - ASC-H pap performed 03/16/2020  Pathology -  Procedure visit on 05/08/2020  Component Date Value Ref Range Status  . SURGICAL PATHOLOGY 05/09/2020    Final-Edited                   Value:SURGICAL PATHOLOGY CASE: [id number] PATIENT: Ann Shepherd Surgical Pathology Report     Clinical History: ASC-H (cm)     FINAL MICROSCOPIC DIAGNOSIS:  A. CERVIX, 6 O'CLOCK, BIOPSY: - Scant benign squamous and endocervical epithelium  B. CERVIX, 12 O'CLOCK, BIOPSY: - High grade squamous intraepithelial lesion, CIN-III  (severe dysplasia/CIS).  C. ENDOCERVIX, CURETTAGE: - Glandular papillary fragments with atypia, see comment.  COMMENT:  C. There are papillary fragments with atypia. The changes are favored to be reactive. Dr. 03[birth year]19[birth year]1983 has reviewed the case.   GROSS DESCRIPTION:  A.  Received in formalin is a 0.8 x 0.7 x 0.1 cm aggregate of mucoid tan tissue, submitted in 1 cassette.  B.  Received in formalin is a 0.7 cm tan friable tissue fragment, submitted in 1 cassette.  C.  Received in formalin is blood tinged mucus that is entirely submitted in one block.  Volume: 1.5 x 0.6 x 0.1 cm (1 AK 05/12/2020    Final Diagnosis performed by 05/14/2020, MD.   Electronically signed 05/13/2020 Technical component performed at [facility]. [facility], [address] [place], [place].  Professional component performed at [facility], [address], [place], [place].  Immunohistochemistry Technical component (if applicable) was performed at [facility]. [address], [place], [place].   IMMUNOHISTOCHEMISTRY DISCLAIMER (if applicable): Some of these immunohistochemical stains may have been developed and the performance characteristics determine by [facility]. Some may not have been cleared or approved by the U.S. Food and Drug Administration. The FDA has determined that such clearance or approval is not necessary. This  test is used for clinical purposes. It should not be regarded as investigational or for research. This laboratory is certified under the Clinical Laboratory Improvement Amendments of 1988 (CLIA-88) as qua                         lified to perform high complexity clinical laboratory testing.  The controls stained appropriately.       Assessment:   CIN III Contraception counseling  Plan:   - Discussion had with patient regarding results and  management options. Recommendations are for LEEP based on colposcopy biopsy results. Discussed that this can be performed outpatient in clinic, or can be performed as a same-day procedure in the OR.  Patient states that she would prefer to have it performed in the office. Discussed risks/benefits of both options. Will schedule for 06/26/2020 in office. Discussed recovery period and restrictions in detail.  - Discussed contraception options with patient. During discussion of the LEEP patient notes that she was afraid that she would not be able to conceive after this, so was considering permanent sterilization. Advised that if pregnancy was desired, she could indeed conceive again, however would have increased risk of cervical stenosis or incompetency after a LEEP. After this discussion, patient notes that she does not think that she still desires more children, but now is less inclined to consider permanent sterilization. Discussed other available methods of contraception which would not require daily management, including NuvaRing, Depo Provera. Could also consider non-hormonal options of Phexxi, condoms, rhythm method, or diaphragm which can be used "as needed".  Patient notes she will stick with the pills for now.    A total of 25 minutes were spent face-to-face with the patient during this encounter and over half of that time dealt with counseling and coordination of care.   Hildred Laser, MD [facility]

## 2020-06-02 NOTE — Patient Instructions (Signed)
Loop Electrosurgical Excision Procedure Loop electrosurgical excision procedure (LEEP) is the cutting and removal (excision) of tissue from the cervix. The cervix is the bottom part of the uterus that opens into the vagina. The tissue that is removed from the cervix is examined to see if there are precancerous cells or cancer cells present. LEEP may be done when:  You have abnormal bleeding from your cervix.  You have an abnormal Pap test result.  Your health care provider finds an abnormality on your cervix during a pelvic exam. LEEP typically only takes a few minutes and is often done in the health care provider's office. The procedure is safe for women who are trying to get pregnant. However, the procedure is usually not done during a menstrual period or during pregnancy. Tell a health care provider about:  Any allergies you have.  All medicines you are taking, including vitamins, herbs, eye drops, creams, and over-the-counter medicines.  Any blood disorders you have.  Any medical conditions you have, including current or past vaginal infections such as herpes or sexually-transmitted infections (STIs).  Whether you are pregnant or may be pregnant.  Whether or not you are having vaginal bleeding on the day of the procedure. What are the risks? Generally, this is a safe procedure. However, problems may occur, including:  Infection.  Bleeding.  Allergic reactions to medicines.  Changes or scarring in the cervix.  Increased risk of early (preterm) labor in future pregnancies. What happens before the procedure?  Ask your health care provider about: ? Changing or stopping your regular medicines. This is especially important if you are taking diabetes medicines or blood thinners. ? Taking medicines such as aspirin and ibuprofen. These medicines can thin your blood. Do not take these medicines unless your health care provider tells you to take them. ? Taking over-the-counter  medicines, vitamins, herbs, and supplements.  Your health care provider may recommend that you take pain medicine before the procedure.  Ask your health care provider if you should plan to have someone take you home after the procedure. What happens during the procedure?   An instrument called a speculum will be placed in your vagina. This will allow your health care provider to see your cervix.  You will be given a medicine to numb the area (local anesthetic). The medicine will be injected into your cervix and the surrounding area.  A solution will be applied to your cervix. This solution will help the health care provider find the abnormal cells that need to be removed.  A thin wire loop will be passed through your vagina. The wire will be used to burn (cauterize) the cervical tissue with an electrical current.  You may feel faint during the procedure. Tell your health care provider right away if you feel this way.  The abnormal cervical tissue will be removed.  Any open blood vessels will be cauterized to prevent bleeding.  A paste may be applied to the cauterized area of your cervix to help prevent bleeding.  The sample of cervical tissue will be examined under a microscope. The procedure may vary among health care providers and hospitals. What can I expect after the procedure? After the procedure, it is common to have:  Mild abdominal cramps that are similar to menstrual cramps. These may last for up to 1 week.  A small amount of pink-tinged or bloody vaginal discharge, including light to moderate bleeding, for 1-2 weeks.  A dark-colored discharge coming from your vagina. This is from   the paste that was used on the cervix to prevent bleeding. It is up to you to get the results of your procedure. Ask your health care provider, or the department that is doing the procedure, when your results will be ready. Follow these instructions at home:  Take over-the-counter and  prescription medicines only as told by your health care provider.  Return to your normal activities as told by your health care provider. Ask your health care provider what activities are safe for you.  Do not put anything in your vagina for 2 weeks after the procedure or until your health care provider says that it is okay. This includes tampons, creams, and douches.  Do not have sex until your health care provider approves.  Keep all follow-up visits as told by your health care provider. This is important. Contact a health care provider if you:  Have a fever or chills.  Feel unusually weak.  Have vaginal bleeding that is heavier or longer than a normal menstrual cycle. A sign of this can be soaking a pad with blood or bleeding with clots.  Develop a bad smelling vaginal discharge.  Have severe abdominal pain or cramping. Summary  Loop electrosurgical excision procedure (LEEP) is the removal of tissue from the cervix. The removed tissue will be checked for precancerous cells or cancer cells.  LEEP typically only takes a few minutes and is often done in the health care provider's office.  Do not put anything in your vagina for 2 weeks after the procedure or until your health care provider says that it is okay. This includes tampons, creams, and douches.  Keep all follow-up visits as told by your health care provider. Ask your health care provider, or the department that is doing the procedure, when your results will be ready. This information is not intended to replace advice given to you by your health care provider. Make sure you discuss any questions you have with your health care provider. Document Revised: 06/08/2018 Document Reviewed: 06/08/2018 Elsevier Patient Education  2020 [employer]vier Inc[employer]  

## 2020-06-26 ENCOUNTER — Encounter: Payer: BC Managed Care – PPO | Admitting: Obstetrics and Gynecology

## 2020-07-21 ENCOUNTER — Ambulatory Visit: Payer: BC Managed Care – PPO | Admitting: Obstetrics and Gynecology

## 2020-07-21 ENCOUNTER — Other Ambulatory Visit (HOSPITAL_COMMUNITY)
Admission: RE | Admit: 2020-07-21 | Discharge: 2020-07-21 | Disposition: A | Discharge status: Home / Self Care | Payer: BC Managed Care – PPO | Source: Ambulatory Visit | Attending: Obstetrics and Gynecology | Admitting: Obstetrics and Gynecology

## 2020-07-21 ENCOUNTER — Encounter: Payer: Self-pay | Admitting: Obstetrics and Gynecology

## 2020-07-21 ENCOUNTER — Other Ambulatory Visit: Payer: Self-pay

## 2020-07-21 VITALS — BP 136/90 | HR 106 | Ht 62.0 in | Wt 221.8 lb

## 2020-07-21 DIAGNOSIS — D069 Carcinoma in situ of cervix, unspecified: Secondary | ICD-10-CM | POA: Diagnosis not present

## 2020-07-21 NOTE — Progress Notes (Signed)
    GYNECOLOGY OFFICE PROCEDURE NOTE  Ann Shepherd is a 35 y.o. [id number] here for LEEP. No GYN concerns. Pap smear and colposcopy history reviewed.  Patient's last menstrual period was 06/24/2020.  She is currently on OCPs for contraception.    Pap: ASC-H on 03/16/2020. Colpo Biopsy with ECC:  A.CERVIX, 6 O'CLOCK, BIOPSY:  - Scant benign squamous and endocervical epithelium   B. CERVIX, 12 O'CLOCK, BIOPSY:  - High grade squamous intraepithelial lesion, CIN-III (severe  dysplasia/CIS).   C. ENDOCERVIX, CURETTAGE:  - Glandular papillary fragments with atypia, see comment.    COMMENT:   C. There are papillary fragments with atypia. The changes are favored to  be reactive. Dr. Luisa Hart has reviewed the case.   Risks, benefits, alternatives, and limitations of procedure explained to patient, including pain, bleeding, infection, failure to remove abnormal tissue and failure to cure dysplasia, need for repeat procedures, damage to pelvic organs, cervical incompetence.  Role of HPV,cervical dysplasia and need for close followup was empasized. Informed verbal consent was obtained. All questions were answered.    Procedure: The patient was placed in lithotomy position and the bivalved coated speculum was placed in the patient's vagina. A grounding pad placed on the patient. Lugol's solution was applied to the cervix and areas of decreased uptake were noted around the transformation zone.   Local anesthesia was administered via an intracervical block using 10 ml of 2% Lidocaine with epinephrine. The suction was turned on and the Medium 1X Fisher Cone Biopsy Excisor on 14 Watts of blended current was used to excise the area of decreased uptake and excise the entire transformation zone. Excellent hemostasis was achieved using roller ball coagulation set at 60 Watts coagulation current. Monsel's solution was then applied and the speculum was removed from the vagina. Specimens were sent to  pathology.  The patient tolerated the procedure well. Post-operative instructions given to patient, including instruction to seek medical attention for persistent bright red bleeding, fever, abdominal/pelvic pain, dysuria, nausea or vomiting. She was also told about the possibility of having copious yellow to black tinged discharge for weeks. She was counseled to avoid anything in the vagina (sex/douching/tampons) for 3-4 weeks. She has a 4 week post-operative check to assess wound healing, review results and discuss further management.      Hildred Laser, MD [facility]

## 2020-07-21 NOTE — Patient Instructions (Signed)
Loop Electrosurgical Excision Procedure Loop electrosurgical excision procedure (LEEP) is the cutting and removal (excision) of tissue from the cervix. The cervix is the bottom part of the uterus that opens into the vagina. The tissue that is removed from the cervix is examined to see if there are precancerous cells or cancer cells present. LEEP may be done when:  You have abnormal bleeding from your cervix.  You have an abnormal Pap test result.  Your health care provider finds an abnormality on your cervix during a pelvic exam. LEEP typically only takes a few minutes and is often done in the health care provider's office. The procedure is safe for women who are trying to get pregnant. However, the procedure is usually not done during a menstrual period or during pregnancy. Tell a health care provider about:  Any allergies you have.  All medicines you are taking, including vitamins, herbs, eye drops, creams, and over-the-counter medicines.  Any blood disorders you have.  Any medical conditions you have, including current or past vaginal infections such as herpes or sexually-transmitted infections (STIs).  Whether you are pregnant or may be pregnant.  Whether or not you are having vaginal bleeding on the day of the procedure. What are the risks? Generally, this is a safe procedure. However, problems may occur, including:  Infection.  Bleeding.  Allergic reactions to medicines.  Changes or scarring in the cervix.  Increased risk of early (preterm) labor in future pregnancies. What happens before the procedure?  Ask your health care provider about: ? Changing or stopping your regular medicines. This is especially important if you are taking diabetes medicines or blood thinners. ? Taking medicines such as aspirin and ibuprofen. These medicines can thin your blood. Do not take these medicines unless your health care provider tells you to take them. ? Taking over-the-counter  medicines, vitamins, herbs, and supplements.  Your health care provider may recommend that you take pain medicine before the procedure.  Ask your health care provider if you should plan to have someone take you home after the procedure. What happens during the procedure?  An instrument called a speculum will be placed in your vagina. This will allow your health care provider to see your cervix.  You will be given a medicine to numb the area (local anesthetic). The medicine will be injected into your cervix and the surrounding area.  A solution will be applied to your cervix. This solution will help the health care provider find the abnormal cells that need to be removed.  A thin wire loop will be passed through your vagina. The wire will be used to burn (cauterize) the cervical tissue with an electrical current.  You may feel faint during the procedure. Tell your health care provider right away if you feel this way.  The abnormal cervical tissue will be removed.  Any open blood vessels will be cauterized to prevent bleeding.  A paste may be applied to the cauterized area of your cervix to help prevent bleeding.  The sample of cervical tissue will be examined under a microscope. The procedure may vary among health care providers and hospitals.   What can I expect after the procedure? After the procedure, it is common to have:  Mild abdominal cramps that are similar to menstrual cramps. These may last for up to 1 week.  A small amount of pink-tinged or bloody vaginal discharge, including light to moderate bleeding, for 1-2 weeks.  A dark-colored discharge coming from your vagina. This is   from the paste that was used on the cervix to prevent bleeding. It is up to you to get the results of your procedure. Ask your health care provider, or the department that is doing the procedure, when your results will be ready. Follow these instructions at home:  Take over-the-counter and  prescription medicines only as told by your health care provider.  Return to your normal activities as told by your health care provider. Ask your health care provider what activities are safe for you.  Do not put anything in your vagina for 2 weeks after the procedure or until your health care provider says that it is okay. This includes tampons, creams, and douches.  Do not have sex until your health care provider approves.  Keep all follow-up visits as told by your health care provider. This is important. Contact a health care provider if you:  Have a fever or chills.  Feel unusually weak.  Have vaginal bleeding that is heavier or longer than a normal menstrual cycle. A sign of this can be soaking a pad with blood or bleeding with clots.  Develop a bad smelling vaginal discharge.  Have severe abdominal pain or cramping. Summary  Loop electrosurgical excision procedure (LEEP) is the removal of tissue from the cervix. The removed tissue will be checked for precancerous cells or cancer cells.  LEEP typically only takes a few minutes and is often done in the health care provider's office.  Do not put anything in your vagina for 2 weeks after the procedure or until your health care provider says that it is okay. This includes tampons, creams, and douches.  Keep all follow-up visits as told by your health care provider. Ask your health care provider, or the department that is doing the procedure, when your results will be ready. This information is not intended to replace advice given to you by your health care provider. Make sure you discuss any questions you have with your health care provider. Document Revised: 06/08/2018 Document Reviewed: 06/08/2018 Elsevier Patient Education  2021 Elsevier Inc.  

## 2020-07-23 LAB — SURGICAL PATHOLOGY

## 2020-08-12 ENCOUNTER — Other Ambulatory Visit: Payer: Self-pay

## 2020-08-12 ENCOUNTER — Ambulatory Visit (INDEPENDENT_AMBULATORY_CARE_PROVIDER_SITE_OTHER): Payer: BC Managed Care – PPO | Admitting: Obstetrics and Gynecology

## 2020-08-12 ENCOUNTER — Encounter: Payer: Self-pay | Admitting: Obstetrics and Gynecology

## 2020-08-12 VITALS — BP 130/94 | HR 108 | Ht 62.0 in | Wt 222.6 lb

## 2020-08-12 DIAGNOSIS — D069 Carcinoma in situ of cervix, unspecified: Secondary | ICD-10-CM

## 2020-08-12 DIAGNOSIS — Z9889 Other specified postprocedural states: Secondary | ICD-10-CM

## 2020-08-12 DIAGNOSIS — Z09 Encounter for follow-up examination after completed treatment for conditions other than malignant neoplasm: Secondary | ICD-10-CM

## 2020-08-12 HISTORY — DX: Other specified postprocedural states: Z98.890

## 2020-08-12 NOTE — Progress Notes (Signed)
OBSTETRICS/GYNECOLOGY POST-OPERATIVE CLINIC VISIT  Subjective:     Ann Shepherd is a 35 y.o. female who presents to the clinic 3 weeks status post LEEP procedure for cervical dysplasia due to ASC-H pap smear and CIN III noted on a cervical biopsy during colposcopy . Denies abnormal discharge or bleeding. The patient is not having any pain.  The following portions of the patient's history were reviewed and updated as appropriate: allergies, current medications, past family history, past medical history, past social history, past surgical history and problem list.  Review of Systems Pertinent items noted in HPI and remainder of comprehensive ROS otherwise negative.    Objective:    BP (!) 130/94   Pulse (!) 108   Ht 5\' 2"  (1.575 m)   Wt 222 lb 9.6 oz (101 kg)   LMP 07/28/2020   BMI 40.71 kg/m  General:  alert and no distress  Abdomen: soft, bowel sounds active, non-tender  Pelvis:   healing well, no drainage, no dehiscence, incision well approximated, normal post-LEEP changes    Pathology:   Procedure visit on 07/21/2020  Component Date Value Ref Range Status  . SURGICAL PATHOLOGY 07/21/2020    Final-Edited                   Value:SURGICAL PATHOLOGY CASE: [id number] PATIENT: Ladina GOMEZ Surgical Pathology Report     Clinical History: CIN 3 (cm)     FINAL MICROSCOPIC DIAGNOSIS:  A. CERVIX, LEEP: - High-grade squamous intraepithelial lesion (CIN2-3, high grade dysplasia) involving the 9-12 o'clock quadrant - No evidence of invasive carcinoma - Resection margins are negative for high-grade dysplasia - The ectocervical margin in 12-3 o'clock quadrant is involved by low-grade dysplasia  B. ENDOCERVIX, CURETTAGE: - Mucoinflammatory debris - Scant benign cervical glandular mucosa - Negative for dysplasia or malignancy    GROSS DESCRIPTION:  A: The specimen is received in formalin and consists of a 2.5 x 1.8 x 1.0 cm portion of tan soft tissue.  A suture  is present designating the presumed 12:00 aspect.  One surface is tan, glistening and granular, and the opposing surface is tan and roughened.  The specimen grossly appears to consist of an anterior portion of a LEEP specimen.  The                          possible ectocervical resection margin is inked black, and the endocervical resection margin is inked yellow.  The specimen is serially sectioned and entirely submitted in 4 cassettes. 1 and 2 = presumed 9-12 o'clock 3 and 4 = presumed 12-3 o'clock  B: The specimen is received in formalin and consists of a 1.1 x 0.6 x 0.3 cm aggregate of red-brown mucus.  The specimen is entirely submitted in 1 cassette.  08-18-1995 07/22/2020)  Final Diagnosis performed by 07/24/2020, MD.   Electronically signed 07/23/2020 Technical component performed at [facility]. [facility], [address] [place], [place].  Professional component performed at [facility], [address] [place], [place].  Immunohistochemistry Technical component (if applicable) was performed at [facility]. [address], [address], [place], [place].   IMMUNOHISTOCHEMISTRY DISCLAIMER (if applicable): Some of these immunohistochemical sta                         ins may have been developed and the performance characteristics determine by [facility].  Some may not have been cleared or approved by the U.S. Food and Drug Administration. The FDA has determined that such clearance or approval is not necessary. This test is used for clinical purposes. It should not be regarded as investigational or for research. This laboratory is certified under the Clinical Laboratory Improvement Amendments of 1988 (CLIA-88) as qualified to perform high complexity clinical laboratory testing.  The controls stained appropriately.     Assessment:   1. Postop check   2. S/P LEEP   3. CIN III (cervical  intraepithelial neoplasia grade III) with severe dysplasia    Plan:   1. Pathology report discussed. Based on findings, CIN III noted on ectocervix, negative ECC. Recommend q 6 month pap smears for 1 year, then return to recommend ASCCP screening guidelines based on results. 2. Activity restrictions: pelvic rest for 1 additional week.  3. Anticipated return to work: has already resumed working. 4. Follow up: 6-7 months for annual exam with CNM Serafina Royals. For repeat pap smear at that time.     Hildred Laser, MD [facility]

## 2020-08-12 NOTE — Progress Notes (Signed)
Pt present for follow up check after LEEP procedure. Pt stated that she was doing well and denied any issues at this time.

## 2021-03-22 ENCOUNTER — Encounter: Payer: BC Managed Care – PPO | Admitting: Certified Nurse Midwife

## 2021-05-13 ENCOUNTER — Other Ambulatory Visit: Payer: Self-pay

## 2021-05-13 MED ORDER — NORGESTIM-ETH ESTRAD TRIPHASIC 0.18/0.215/0.25 MG-25 MCG PO TABS
1.0000 | ORAL_TABLET | Freq: Every day | ORAL | 0 refills | Status: DC
Start: 2021-05-13 — End: 2021-08-19

## 2021-06-24 NOTE — Progress Notes (Signed)
GYNECOLOGY ANNUAL PHYSICAL EXAM PROGRESS NOTE  Subjective:    Ann Shepherd is a 36 y.o. [id number] female who presents for an annual exam. She has a history significant for CIN III, s/p LEEP in 06/2020. The patient has no complaints today. The patient is sexually active (1 partner). The patient participates in regular exercise: no. Has the patient ever been transfused or tattooed?: no. The patient reports that there is not domestic violence in her life.    Menstrual History: Menarche age: 25 or 64 Patient's last menstrual period was 05/28/2021. Period Duration (Days): 3 Period Pattern: Regular Menstrual Flow: Moderate Menstrual Control: Thin pad Menstrual Control Change Freq (Hours): 1-2 Dysmenorrhea: None   Gynecologic History:  Contraception: OCP (estrogen/progesterone) History of STI's: HPV Last Pap: 10;18/2021. Results were: abnormal, ASC-H.  S/p colposcopy in 05/2020  with CIN III on biopsy. LEEP performed 06/2020 with CIN II-III with benign ECC.      Upstream - 06/25/21 0930       Pregnancy Intention Screening   Does the patient want to become pregnant in the next year? No    Does the patient's partner want to become pregnant in the next year? No    Would the patient like to discuss contraceptive options today? No      Contraception Wrap Up   Current Method Oral Contraceptive    End Method Oral Contraceptive    Contraception Counseling Provided No            The pregnancy intention screening data noted above was reviewed. Potential methods of contraception were discussed. The patient elected to proceed with Oral Contraceptive.    OB History  Gravida Para Term Preterm AB Living  3 2 2  0 1 2  SAB IAB Ectopic Multiple Live Births  1 0 0 0 2    # Outcome Date GA Lbr Len/2nd Weight Sex Delivery Anes PTL Lv  3 Term 12/01/10 [redacted]w[redacted]d  6 lb 3.2 oz (2.812 kg) F Vag-Spont  N LIV     Name: Ann Shepherd  2 SAB 2010 [redacted]w[redacted]d       ND  1 Term 09/26/06 [redacted]w[redacted]d  5 lb 9.6 oz (2.54 kg)  F Vag-Spont None N LIV     Name: Ann Shepherd    Obstetric Comments  2010-sab & D&C    Past Medical History:  Diagnosis Date   HPV in female 04/2015   S/P LEEP 08/12/2020    Past Surgical History:  Procedure Laterality Date   CERVICAL BIOPSY  W/ LOOP ELECTRODE EXCISION     DILATION AND CURETTAGE OF UTERUS      Family History  Problem Relation Age of Onset   Diabetes Mother    Breast cancer Neg Hx    Ovarian cancer Neg Hx    Colon cancer Neg Hx     Social History   Socioeconomic History   Marital status: Single    Spouse name: Not on file   Number of children: Not on file   Years of education: Not on file   Highest education level: Not on file  Occupational History   Not on file  Tobacco Use   Smoking status: Never   Smokeless tobacco: Never  Vaping Use   Vaping Use: Never used  Substance and Sexual Activity   Alcohol use: No   Drug use: No   Sexual activity: Not Currently    Partners: Male    Birth control/protection: Pill  Other Topics Concern   Not on file  Social History Narrative   Not on file   Social Determinants of Health   Financial Resource Strain: Not on file  Food Insecurity: Not on file  Transportation Needs: Not on file  Physical Activity: Not on file  Stress: Not on file  Social Connections: Not on file  Intimate Partner Violence: Not on file    Current Outpatient Medications on File Prior to Visit  Medication Sig Dispense Refill   Norgestimate-Ethinyl Estradiol Triphasic (TRI-LO-MARZIA) 0.18/0.215/0.25 MG-25 MCG tab Take 1 tablet by mouth daily. 84 tablet 0   Vitamin D, Ergocalciferol, (DRISDOL) 1.25 MG (50000 UNIT) CAPS capsule Take 1 capsule (50,000 Units total) by mouth every 7 (seven) days. 14 capsule 2   No current facility-administered medications on file prior to visit.    No Known Allergies   Review of Systems Constitutional: negative for chills, fatigue, fevers and sweats Eyes: negative for irritation, redness and visual  disturbance Ears, nose, mouth, throat, and face: negative for hearing loss, nasal congestion, snoring and tinnitus Respiratory: negative for asthma, cough, sputum Cardiovascular: negative for chest pain, dyspnea, exertional chest pressure/discomfort, irregular heart beat, palpitations and syncope Gastrointestinal: negative for abdominal pain, change in bowel habits, nausea and vomiting Genitourinary: negative for abnormal menstrual periods, genital lesions, sexual problems and vaginal discharge, dysuria and urinary incontinence Integument/breast: negative for breast lump, breast tenderness and nipple discharge Hematologic/lymphatic: negative for bleeding and easy bruising Musculoskeletal:negative for back pain and muscle weakness Neurological: negative for dizziness, headaches, vertigo and weakness Endocrine: negative for diabetic symptoms including polydipsia, polyuria and skin dryness Allergic/Immunologic: negative for hay fever and urticaria      Objective:  Blood pressure 127/86, pulse (!) 102, height 5\' 3"  (1.6 m), weight 209 lb (94.8 kg), last menstrual period 05/28/2021, SpO2 98 %.  Body mass index is 37.02 kg/m.    General Appearance:    Alert, cooperative, no distress, appears stated age  Head:    Normocephalic, without obvious abnormality, atraumatic  Eyes:    PERRL, conjunctiva/corneas clear, EOM's intact, both eyes  Ears:    Normal external ear canals, both ears  Nose:   Nares normal, septum midline, mucosa normal, no drainage or sinus tenderness  Throat:   Lips, mucosa, and tongue normal; teeth and gums normal  Neck:   Supple, symmetrical, trachea midline, no adenopathy; thyroid: no enlargement/tenderness/nodules; no carotid bruit or JVD  Back:     Symmetric, no curvature, ROM normal, no CVA tenderness  Lungs:     Clear to auscultation bilaterally, respirations unlabored  Chest Wall:    No tenderness or deformity   Heart:    Regular rate and rhythm, S1 and S2 normal, no  murmur, rub or gallop  Breast Exam:    No tenderness, masses, or nipple abnormality  Abdomen:     Soft, non-tender, bowel sounds active all four quadrants, no masses, no organomegaly.    Genitalia:    Pelvic:external genitalia normal, vagina without lesions, discharge, or tenderness, rectovaginal septum  normal. Cervix normal in appearance, no cervical motion tenderness, no adnexal masses or tenderness.  Uterus normal size, shape, mobile, regular contours, nontender.  Rectal:    Normal external sphincter.  No hemorrhoids appreciated. Internal exam not done.   Extremities:   Extremities normal, atraumatic, no cyanosis or edema  Pulses:   2+ and symmetric all extremities  Skin:   Skin color, texture, turgor normal, no rashes or lesions  Lymph nodes:   Cervical, supraclavicular, and axillary nodes normal  Neurologic:   CNII-XII intact,  normal strength, sensation and reflexes throughout   .  Labs:  Lab Results  Component Value Date   WBC 7.1 03/20/2020   HGB 12.6 03/20/2020   HCT 38.1 03/20/2020   MCV 91 03/20/2020   PLT 287 03/20/2020    Lab Results  Component Value Date   CREATININE 0.63 03/20/2020   BUN 10 03/20/2020   NA 141 03/20/2020   K 4.6 03/20/2020   CL 105 03/20/2020   CO2 25 03/20/2020    Lab Results  Component Value Date   ALT 18 03/20/2020   AST 10 03/20/2020   ALKPHOS 88 03/20/2020   BILITOT <0.2 03/20/2020    Lab Results  Component Value Date   TSH 3.360 03/20/2020     Assessment:   1. Encounter for well woman exam with routine gynecological exam   2. History of cervical dysplasia   3. S/P LEEP   4. Needs flu shot   5. Obesity (BMI 35.0-39.9 without comorbidity)      Plan:  Blood tests: CBC with diff, Comprehensive metabolic panel, Lipoproteins, TSH, and HgbA1c. Breast self exam technique reviewed and patient encouraged to perform self-exam monthly. Contraception: OCP (estrogen/progesterone). Discussed healthy lifestyle modifications. Mammogram   Not age appropriate Pap smear ordered for h/o cervical dysplasia s/p LEEP last year.  COVID vaccination status: up to date, is eligible for booster.  Flu vaccine: given today.  Follow up in 1 year for annual exam   Hildred LaserAnika Deina Lipsey, MD [facility]

## 2021-06-25 ENCOUNTER — Encounter: Payer: Self-pay | Admitting: Obstetrics and Gynecology

## 2021-06-25 ENCOUNTER — Other Ambulatory Visit: Payer: Self-pay

## 2021-06-25 ENCOUNTER — Other Ambulatory Visit (HOSPITAL_COMMUNITY)
Admission: RE | Admit: 2021-06-25 | Discharge: 2021-06-25 | Disposition: A | Discharge status: Home / Self Care | Payer: BC Managed Care – PPO | Source: Ambulatory Visit | Attending: Obstetrics and Gynecology | Admitting: Obstetrics and Gynecology

## 2021-06-25 ENCOUNTER — Ambulatory Visit (INDEPENDENT_AMBULATORY_CARE_PROVIDER_SITE_OTHER): Payer: Self-pay | Admitting: Obstetrics and Gynecology

## 2021-06-25 VITALS — BP 127/86 | HR 102 | Ht 63.0 in | Wt 209.0 lb

## 2021-06-25 DIAGNOSIS — Z9889 Other specified postprocedural states: Secondary | ICD-10-CM

## 2021-06-25 DIAGNOSIS — D069 Carcinoma in situ of cervix, unspecified: Secondary | ICD-10-CM | POA: Insufficient documentation

## 2021-06-25 DIAGNOSIS — Z124 Encounter for screening for malignant neoplasm of cervix: Secondary | ICD-10-CM

## 2021-06-25 DIAGNOSIS — Z23 Encounter for immunization: Secondary | ICD-10-CM

## 2021-06-25 DIAGNOSIS — E669 Obesity, unspecified: Secondary | ICD-10-CM

## 2021-06-25 DIAGNOSIS — Z87410 Personal history of cervical dysplasia: Secondary | ICD-10-CM | POA: Insufficient documentation

## 2021-06-25 DIAGNOSIS — Z01419 Encounter for gynecological examination (general) (routine) without abnormal findings: Secondary | ICD-10-CM

## 2021-06-26 LAB — CBC
Hematocrit: 38 % (ref 34.0–46.6)
Hemoglobin: 12.4 g/dL (ref 11.1–15.9)
MCH: 29.2 pg (ref 26.6–33.0)
MCHC: 32.6 g/dL (ref 31.5–35.7)
MCV: 90 fL (ref 79–97)
Platelets: 344 10*3/uL (ref 150–450)
RBC: 4.24 x10E6/uL (ref 3.77–5.28)
RDW: 13.5 % (ref 11.7–15.4)
WBC: 7 10*3/uL (ref 3.4–10.8)

## 2021-06-26 LAB — COMPREHENSIVE METABOLIC PANEL
ALT: 13 IU/L (ref 0–32)
AST: 12 IU/L (ref 0–40)
Albumin/Globulin Ratio: 1.5 (ref 1.2–2.2)
Albumin: 4.1 g/dL (ref 3.8–4.8)
Alkaline Phosphatase: 90 IU/L (ref 44–121)
BUN/Creatinine Ratio: 12 (ref 9–23)
BUN: 9 mg/dL (ref 6–20)
Bilirubin Total: <0.2 mg/dL (ref 0.0–1.2)
CO2: 22 mmol/L (ref 20–29)
Calcium: 8.4 mg/dL — ABNORMAL LOW (ref 8.7–10.2)
Chloride: 105 mmol/L (ref 96–106)
Creatinine, Ser: 0.73 mg/dL (ref 0.57–1.00)
Globulin, Total: 2.8 g/dL (ref 1.5–4.5)
Glucose: 89 mg/dL (ref 70–99)
Potassium: 4 mmol/L (ref 3.5–5.2)
Sodium: 139 mmol/L (ref 134–144)
Total Protein: 6.9 g/dL (ref 6.0–8.5)
eGFR: 109 mL/min/{1.73_m2} (ref >59)

## 2021-06-26 LAB — LIPID PANEL
Chol/HDL Ratio: 5 ratio — ABNORMAL HIGH (ref 0.0–4.4)
Cholesterol, Total: 174 mg/dL (ref 100–199)
HDL: 35 mg/dL — ABNORMAL LOW (ref >39)
LDL Chol Calc (NIH): 117 mg/dL — ABNORMAL HIGH (ref 0–99)
Triglycerides: 118 mg/dL (ref 0–149)
VLDL Cholesterol Cal: 22 mg/dL (ref 5–40)

## 2021-06-26 LAB — HEMOGLOBIN A1C
Est. average glucose Bld gHb Est-mCnc: 126 mg/dL
Hgb A1c MFr Bld: 6 % — ABNORMAL HIGH (ref 4.8–5.6)

## 2021-06-26 LAB — TSH: TSH: 2.92 u[IU]/mL (ref 0.450–4.500)

## 2021-06-27 ENCOUNTER — Encounter: Payer: Self-pay | Admitting: Obstetrics and Gynecology

## 2021-07-01 LAB — CYTOLOGY - PAP
Comment: NEGATIVE
Diagnosis: UNDETERMINED — AB
High risk HPV: NEGATIVE

## 2021-07-20 NOTE — Progress Notes (Signed)
GYNECOLOGY PROGRESS NOTE  Subjective:    Patient ID: Ann Shepherd, female    DOB: 12-20-1985, 36 y.o.   MRN: [id number]  HPI  Patient is a 36 y.o. [id number] female who presents to discuss lab results from last visit (wellness exam performed 06/25/2021).   The following portions of the patient's history were reviewed and updated as appropriate: allergies, current medications, past family history, past medical history, past social history, past surgical history, and problem list.  Review of Systems Pertinent items noted in HPI and remainder of comprehensive ROS otherwise negative.   Objective:   Blood pressure 129/83, pulse 93, height 5' 2"  (1.575 m), weight 208 lb 4.8 oz (94.5 kg).  Body mass index is 38.1 kg/m. General appearance: alert and no distress Remainder of exam deferred   Labs:   Office Visit on 06/25/2021  Component Date Value Ref Range Status   High risk HPV 06/25/2021 Negative   Final   Adequacy 06/25/2021 Satisfactory for evaluation; transformation zone component PRESENT.   Final   Diagnosis 06/25/2021 - Atypical squamous cells of undetermined significance (ASC-US) (A)   Final   Comment 06/25/2021 Normal Reference Range HPV - Negative   Final   TSH 06/25/2021 2.920  0.450 - 4.500 uIU/mL Final   Hgb A1c MFr Bld 06/25/2021 6.0 (H)  4.8 - 5.6 % Final   Comment:          Prediabetes: 5.7 - 6.4          Diabetes: >6.4          Glycemic control for adults with diabetes: <7.0    Est. average glucose Bld gHb Est-m* 06/25/2021 126  mg/dL Final   Cholesterol, Total 06/25/2021 174  100 - 199 mg/dL Final   Triglycerides 06/25/2021 118  0 - 149 mg/dL Final   HDL 06/25/2021 35 (L)  >39 mg/dL Final   VLDL Cholesterol Cal 06/25/2021 22  5 - 40 mg/dL Final   LDL Chol Calc (NIH) 06/25/2021 117 (H)  0 - 99 mg/dL Final   Chol/HDL Ratio 06/25/2021 5.0 (H)  0.0 - 4.4 ratio Final   Comment:                                   T. Chol/HDL Ratio                                              Men  Women                               1/2 Avg.Risk  3.4    3.3                                   Avg.Risk  5.0    4.4                                2X Avg.Risk  9.6    7.1  3X Avg.Risk 23.4   11.0    WBC 06/25/2021 7.0  3.4 - 10.8 x10E3/uL Final   RBC 06/25/2021 4.24  3.77 - 5.28 x10E6/uL Final   Hemoglobin 06/25/2021 12.4  11.1 - 15.9 g/dL Final   Hematocrit 06/25/2021 38.0  34.0 - 46.6 % Final   MCV 06/25/2021 90  79 - 97 fL Final   MCH 06/25/2021 29.2  26.6 - 33.0 pg Final   MCHC 06/25/2021 32.6  31.5 - 35.7 g/dL Final   RDW 06/25/2021 13.5  11.7 - 15.4 % Final   Platelets 06/25/2021 344  150 - 450 x10E3/uL Final   Glucose 06/25/2021 89  70 - 99 mg/dL Final   BUN 06/25/2021 9  6 - 20 mg/dL Final   Creatinine, Ser 06/25/2021 0.73  0.57 - 1.00 mg/dL Final   eGFR 06/25/2021 109  >59 mL/min/1.73 Final   BUN/Creatinine Ratio 06/25/2021 12  9 - 23 Final   Sodium 06/25/2021 139  134 - 144 mmol/L Final   Potassium 06/25/2021 4.0  3.5 - 5.2 mmol/L Final   Chloride 06/25/2021 105  96 - 106 mmol/L Final   CO2 06/25/2021 22  20 - 29 mmol/L Final   Calcium 06/25/2021 8.4 (L)  8.7 - 10.2 mg/dL Final   Total Protein 06/25/2021 6.9  6.0 - 8.5 g/dL Final   Albumin 06/25/2021 4.1  3.8 - 4.8 g/dL Final   Globulin, Total 06/25/2021 2.8  1.5 - 4.5 g/dL Final   Albumin/Globulin Ratio 06/25/2021 1.5  1.2 - 2.2 Final   Bilirubin Total 06/25/2021 <0.2  0.0 - 1.2 mg/dL Final   Alkaline Phosphatase 06/25/2021 90  44 - 121 IU/L Final   AST 06/25/2021 12  0 - 40 IU/L Final   ALT 06/25/2021 13  0 - 32 IU/L Final    Assessment:   1. Prediabetes   2. Dyslipidemia (high LDL; low HDL)   3. BMI 35.0-35.9,adult   4. ASCUS of cervix with negative high risk HPV      Plan:   1. Prediabetes - Lengthy discussion had regarding diagnosis of prediabetes.  - Discussed dietary and lifestyle modification. Offered referral to Nutritionist. Patient desires to try on her own for 1  month, and if she needs further assistance, will plan to see nutritionist. - Given a prediabetes dietary plan.   2. Dyslipidemia (high LDL; low HDL) - Discussed management to include dietary and lifestyle changes. Also encouraged initiation of daily fish oil supplement.   3. BMI 35.0-35.9,adult - Discussed that both conditions are associated with obesity. Discussed readiness to consider engaging in weight loss to achieve a healthier BMI. Patient notes that she is willing to consider. States that she used Phentermine ~ 15 years ago to help with weight loss which was very beneficial for her. Dicussed used again if she desired to begin weight loss. Advised that we can begin next month, once she has begun initiating lifestyle changes.  - reviewed current diet, patient admits to drinking a soda at least daily (Pepsi). May consider addition of Topamax to weight loss regimen if medications initiated next month to help wean from sodas.   4. ASCUS of cervix with negative high risk HPV - Discuss causes of ASCUS pap smears, however reassured patient that as her HPV testing was negative, she was at low risk for cervical cancer at this time.  She can continue routine q 3-5 years.    RTC in 1 month for weight loss management.   A total of 35 minutes  were spent face-to-face with the patient during this encounter and over half of that time involved counseling and coordination of care.   Rubie Maid, MD [facility]

## 2021-07-23 ENCOUNTER — Other Ambulatory Visit: Payer: Self-pay

## 2021-07-23 ENCOUNTER — Ambulatory Visit (INDEPENDENT_AMBULATORY_CARE_PROVIDER_SITE_OTHER): Payer: Self-pay | Admitting: Obstetrics and Gynecology

## 2021-07-23 ENCOUNTER — Encounter: Payer: Self-pay | Admitting: Obstetrics and Gynecology

## 2021-07-23 VITALS — BP 129/83 | HR 93 | Ht 62.0 in | Wt 208.3 lb

## 2021-07-23 DIAGNOSIS — R7303 Prediabetes: Secondary | ICD-10-CM

## 2021-07-23 DIAGNOSIS — Z6835 Body mass index (BMI) 35.0-35.9, adult: Secondary | ICD-10-CM

## 2021-07-23 DIAGNOSIS — E785 Hyperlipidemia, unspecified: Secondary | ICD-10-CM

## 2021-07-23 DIAGNOSIS — R87610 Atypical squamous cells of undetermined significance on cytologic smear of cervix (ASC-US): Secondary | ICD-10-CM

## 2021-07-23 NOTE — Patient Instructions (Addendum)
Prediabetes Eating Plan °Prediabetes is a condition that causes blood sugar (glucose) levels to be higher than normal. This increases the risk for developing type 2 diabetes (type 2 diabetes mellitus). Working with a health care provider or nutrition specialist (dietitian) to make diet and lifestyle changes can help prevent the onset of diabetes. These changes may help you: °Control your blood glucose levels. °Improve your cholesterol levels. °Manage your blood pressure. °What are tips for following this plan? °Reading food labels °Read food labels to check the amount of fat, salt (sodium), and sugar in prepackaged foods. Avoid foods that have: °Saturated fats. °Trans fats. °Added sugars. °Avoid foods that have more than 300 milligrams (mg) of sodium per serving. Limit your sodium intake to less than 2,300 mg each day. °Shopping °Avoid buying pre-made and processed foods. °Avoid buying drinks with added sugar. °Cooking °Cook with olive oil. Do not use butter, lard, or ghee. °Bake, broil, grill, steam, or boil foods. Avoid frying. °Meal planning ° °Work with your dietitian to create an eating plan that is right for you. This may include tracking how many calories you take in each day. Use a food diary, notebook, or mobile application to track what you eat at each meal. °Consider following a Mediterranean diet. This includes: °Eating several servings of fresh fruits and vegetables each day. °Eating fish at least twice a week. °Eating one serving each day of whole grains, beans, nuts, and seeds. °Using olive oil instead of other fats. °Limiting alcohol. °Limiting red meat. °Using nonfat or low-fat dairy products. °Consider following a plant-based diet. This includes dietary choices that focus on eating mostly vegetables and fruit, grains, beans, nuts, and seeds. °If you have high blood pressure, you may need to limit your sodium intake or follow a diet such as the DASH (Dietary Approaches to Stop Hypertension) eating  plan. The DASH diet aims to lower high blood pressure. °Lifestyle °Set weight loss goals with help from your health care team. It is recommended that most people with prediabetes lose 7% of their body weight. °Exercise for at least 30 minutes 5 or more days a week. °Attend a support group or seek support from a mental health counselor. °Take over-the-counter and prescription medicines only as told by your health care provider. °What foods are recommended? °Fruits °Berries. Bananas. Apples. Oranges. Grapes. Papaya. Mango. Pomegranate. Kiwi. Grapefruit. Cherries. °Vegetables °Lettuce. Spinach. Peas. Beets. Cauliflower. Cabbage. Broccoli. Carrots. Tomatoes. Squash. Eggplant. Herbs. Peppers. Onions. Cucumbers. Brussels sprouts. °Grains °Whole grains, such as whole-wheat or whole-grain breads, crackers, cereals, and pasta. Unsweetened oatmeal. Bulgur. Barley. Quinoa. Brown rice. Corn or whole-wheat flour tortillas or taco shells. °Meats and other proteins °Seafood. Poultry without skin. Lean cuts of pork and beef. Tofu. Eggs. Nuts. Beans. °Dairy °Low-fat or fat-free dairy products, such as yogurt, cottage cheese, and cheese. °Beverages °Water. Tea. Coffee. Sugar-free or diet soda. Seltzer water. Low-fat or nonfat milk. Milk alternatives, such as soy or almond milk. °Fats and oils °Olive oil. Canola oil. Sunflower oil. Grapeseed oil. Avocado. Walnuts. °Sweets and desserts °Sugar-free or low-fat pudding. Sugar-free or low-fat ice cream and other frozen treats. °Seasonings and condiments °Herbs. Sodium-free spices. Mustard. Relish. Low-salt, low-sugar ketchup. Low-salt, low-sugar barbecue sauce. Low-fat or fat-free mayonnaise. °The items listed above may not be a complete list of recommended foods and beverages. Contact a dietitian for more information. °What foods are not recommended? °Fruits °Fruits canned with syrup. °Vegetables °Canned vegetables. Frozen vegetables with butter or cream sauce. °Grains °Refined white  flour and flour   products, such as bread, pasta, snack foods, and cereals. °Meats and other proteins °Fatty cuts of meat. Poultry with skin. Breaded or fried meat. Processed meats. °Dairy °Full-fat yogurt, cheese, or milk. °Beverages °Sweetened drinks, such as iced tea and soda. °Fats and oils °Butter. Lard. Ghee. °Sweets and desserts °Baked goods, such as cake, cupcakes, pastries, cookies, and cheesecake. °Seasonings and condiments °Spice mixes with added salt. Ketchup. Barbecue sauce. Mayonnaise. °The items listed above may not be a complete list of foods and beverages that are not recommended. Contact a dietitian for more information. °Where to find more information °American Diabetes Association: www.diabetes.org °Summary °You may need to make diet and lifestyle changes to help prevent the onset of diabetes. These changes can help you control blood sugar, improve cholesterol levels, and manage blood pressure. °Set weight loss goals with help from your health care team. It is recommended that most people with prediabetes lose 7% of their body weight. °Consider following a Mediterranean diet. This includes eating plenty of fresh fruits and vegetables, whole grains, beans, nuts, seeds, fish, and low-fat dairy, and using olive oil instead of other fats. °This information is not intended to replace advice given to you by your health care provider. Make sure you discuss any questions you have with your health care provider. °Document Revised: 08/15/2019 Document Reviewed: 08/15/2019 °Elsevier Patient Education © 2022 [employer] ° °

## 2021-08-19 ENCOUNTER — Ambulatory Visit (INDEPENDENT_AMBULATORY_CARE_PROVIDER_SITE_OTHER): Payer: Self-pay | Admitting: Obstetrics and Gynecology

## 2021-08-19 ENCOUNTER — Encounter: Payer: Self-pay | Admitting: Obstetrics and Gynecology

## 2021-08-19 ENCOUNTER — Other Ambulatory Visit: Payer: Self-pay

## 2021-08-19 VITALS — BP 123/84 | HR 83 | Resp 16 | Ht 63.0 in | Wt 208.6 lb

## 2021-08-19 DIAGNOSIS — E669 Obesity, unspecified: Secondary | ICD-10-CM

## 2021-08-19 DIAGNOSIS — Z713 Dietary counseling and surveillance: Secondary | ICD-10-CM

## 2021-08-19 MED ORDER — PHENTERMINE HCL 37.5 MG PO CAPS: 37.5000 mg | ORAL_CAPSULE | ORAL | 2 refills | Status: DC

## 2021-08-19 MED ORDER — PHENTERMINE HCL 15 MG PO CAPS: ORAL_CAPSULE | ORAL | 0 refills | Status: DC

## 2021-08-19 MED ORDER — NORGESTIM-ETH ESTRAD TRIPHASIC 0.18/0.215/0.25 MG-25 MCG PO TABS: 1.0000 | ORAL_TABLET | Freq: Every day | ORAL | 3 refills | Status: DC

## 2021-08-19 NOTE — Addendum Note (Signed)
Addended by: Augusto Gamble on: 08/19/2021 02:33 PM ? ? Modules accepted: Orders ? ?

## 2021-08-19 NOTE — Progress Notes (Addendum)
? ?  GYNECOLOGY CLINIC PROGRESS NOTE ?Subjective:  ?  ? Ann Shepherd is a 36 y.o. female here for discussion regarding weight loss. She has noted a weight gain of approximately 30-40 pounds over the last 2 years. She feels ideal weight is 150 pounds. History of eating disorders: none. There is a family history positive for obesity in the patient and mother. Previous treatments for obesity include self-directed dieting. Has recently started back walking. Tried Phentermine ~ 15 years ago. Obesity associated medical conditions: none. Obesity associated medications: none. Cardiovascular risk factors besides obesity: obesity (BMI >= 30 kg/m2). ? ?Patient also desires refill on her birth control.  Notes that she has no more refills at her pharmacy.  ? ?The following portions of the patient's history were reviewed and updated as appropriate:  ? ?She  has a past medical history of HPV in female (04/2015) and S/P LEEP (08/12/2020). ? ?Her family history includes Cancer in her brother; Diabetes in her mother. ? ?Current Outpatient Medications on File Prior to Visit  ?Medication Sig Dispense Refill  ? Norgestimate-Ethinyl Estradiol Triphasic (TRI-LO-MARZIA) 0.18/0.215/0.25 MG-25 MCG tab Take 1 tablet by mouth daily. 84 tablet 0  ? ?No current facility-administered medications on file prior to visit.  ? ?She has No Known Allergies.. ? ?Review of Systems ?Pertinent items noted in HPI and remainder of comprehensive ROS otherwise negative. ?  ?Objective:  ? ?Blood pressure 123/84, pulse 83, resp. rate 16, height 5\' 3"  (1.6 m), weight 208 lb 9.6 oz (94.6 kg). Body mass index is 36.95 kg/m?. ?General appearance: alert and no distress ?Lungs: clear to auscultation bilaterally ?Heart: regular rate and rhythm, S1, S2 normal, no murmur, click, rub or gallop ?Abdomen: soft, non-tender; bowel sounds normal; no masses,  no organomegaly.  Waist circumference 47 1/2 inches.  ?Extremities: extremities normal, atraumatic, no cyanosis or  edema ?Neurologic: Grossly normal ?  ?Assessment:  ? ?Obesity, Class II. I assessed Ann Shepherd to be in an action stage with respect to weight loss. ?  ?Plan:  ? ?General weight loss/lifestyle modification strategies discussed (elicit support from others; identify saboteurs; non-food rewards, etc). ?Diet interventions: low calorie (1000 kCal/d) deficit diet and qualitative changes (increase low-fat,  high-fiber foods). ?Regular aerobic exercise program discussed. ?Medication: phentermine.  Refill also given on OCP.  ?Follow up in: 6 weeks for weight check. ? ? ? ? , MD ?[facility] ? ? ? ?

## 2021-09-30 ENCOUNTER — Encounter: Payer: Self-pay | Admitting: Obstetrics and Gynecology

## 2021-09-30 NOTE — Progress Notes (Signed)
? ? ?  GYNECOLOGY PROGRESS NOTE ? ?Subjective:  ? ? Patient ID: Ann Shepherd, female    DOB: 01-May-1986, 36 y.o.   MRN: [id number] ? ?HPI ? Patient is a 36 y.o. female who presents for 6  week weight management follow up. She has a past history of obesity, prediabetes and dyslipidemia. She initiated use of Phentermine 6  weeks ago.  Denies any undesirable side effects and reports compliance with medications. Feels more jittery on 37.5 mg dosing if taking on an empty stomach. Just started higher dose 3 weeks ago. Notes weighing herself at home yesterday and was 198 lbs.  ?  ?Current interventions:  ?1. Diet - She continue to cut back on fast food consumption. She feels that she is eating less, because she is getting full faster than usual. She is eating more vegetables, especially carrots. Has cut out sodas.  ?2. Activity - She exercises 30-40 minutes daily, mostly walking/jogging. ?3. Reports bowel movements are normal.  ? ? ?The following portions of the patient's history were reviewed and updated as appropriate: allergies, current medications, past family history, past medical history, past social history, past surgical history, and problem list. ? ?Review of Systems ?Pertinent items are noted in HPI.  ? ?Objective:  ?  ? ?  10/06/2021  ?  8:31 AM 08/19/2021  ?  9:55 AM 07/23/2021  ?  9:42 AM  ?Vitals with BMI  ?Height 5\' 2"  5\' 3"  5\' 2"   ?Weight 202 lbs 3 oz 208 lbs 10 oz 208 lbs 5 oz  ?BMI 36.97 36.96 38.09  ?Systolic 128 123  ?Diastolic 90 84 83  ?Pulse 99 83 93  ? ? ?General appearance: alert and no distress ?Abdomen: soft, non-tender.  Waist circumference 44 in.  ? ? ?Labs:  ? ?Assessment:  ? ?Weight management ?Obesity, Body mass index is 36.98 kg/m? ?Prediabetes ?Dyslipidemia ? ?Plan:  ? ?Weight management  - continue to encourage dietary changes, increasing lean protein. Advised on adding small weights to exercise regimen. Can continue current management.   ?Follow up in 4-6 weeks for weight loss check.   ? ? ? ? , MD ?[facility] ? ?

## 2021-10-06 ENCOUNTER — Encounter: Payer: Self-pay | Admitting: Obstetrics and Gynecology

## 2021-10-06 ENCOUNTER — Ambulatory Visit (INDEPENDENT_AMBULATORY_CARE_PROVIDER_SITE_OTHER): Payer: Self-pay | Admitting: Obstetrics and Gynecology

## 2021-10-06 VITALS — BP 128/90 | HR 99 | Resp 16 | Ht 62.0 in | Wt 202.2 lb

## 2021-10-06 DIAGNOSIS — Z7689 Persons encountering health services in other specified circumstances: Secondary | ICD-10-CM

## 2021-10-06 DIAGNOSIS — R7303 Prediabetes: Secondary | ICD-10-CM

## 2021-10-06 DIAGNOSIS — E669 Obesity, unspecified: Secondary | ICD-10-CM

## 2021-10-06 DIAGNOSIS — E785 Hyperlipidemia, unspecified: Secondary | ICD-10-CM

## 2021-10-06 NOTE — Patient Instructions (Signed)

## 2021-10-24 ENCOUNTER — Ambulatory Visit
Admission: EM | Admit: 2021-10-24 | Discharge: 2021-10-24 | Disposition: A | Discharge status: Home / Self Care | Payer: Medicaid Other | Attending: Physician Assistant | Admitting: Physician Assistant

## 2021-10-24 ENCOUNTER — Encounter: Payer: Self-pay | Admitting: Emergency Medicine

## 2021-10-24 ENCOUNTER — Other Ambulatory Visit: Payer: Self-pay

## 2021-10-24 DIAGNOSIS — F419 Anxiety disorder, unspecified: Secondary | ICD-10-CM

## 2021-10-24 DIAGNOSIS — R9431 Abnormal electrocardiogram [ECG] [EKG]: Secondary | ICD-10-CM

## 2021-10-24 DIAGNOSIS — R000 Tachycardia, unspecified: Secondary | ICD-10-CM

## 2021-10-24 MED ORDER — HYDROXYZINE HCL 25 MG PO TABS: 25.0000 mg | ORAL_TABLET | Freq: Three times a day (TID) | ORAL | 0 refills | Status: DC | PRN

## 2021-10-24 NOTE — ED Provider Notes (Signed)
[facility]    CSN: [id number] Arrival date & time: 10/24/21  1317      History   Chief Complaint Chief Complaint  Patient presents with   Panic Attack    HPI Ann Shepherd is a 36 y.o. female presenting for concerns about possible panic attack that occurred about 2.5 hours ago and lasted about 20 minutes.  She says she was driving in [place] traffic when this occurred.  Reports palpitations, shortness of breath and dry mouth.  She says she had to pull over the side of the road and let her father drive.  She reports having another panic attack a couple of weeks ago when she was in the car pickup line at her kids school.  Before this, she never experienced any anxiety or panic attacks.  She denies any history of depression.  Has never taken any antidepressants or antianxiety medicine.  Patient does report some stressors.  Reports that her brother passed away about 5 months ago.  She also reports concerns about her health and wanting to lose weight. Patient has been taking phentermine for weight loss for the past 2 months. She follows up with Dr Hildred Laser at [facility] for weight loss management. Her next appointment is in 2 weeks.  Patient has not, experiencing any symptoms.  She is denying chest pain, shortness of breath, palpitations, weakness.  Medical history significant for pre-diabetes, hyperlipidemia, obesity.  HPI  Past Medical History:  Diagnosis Date   HPV in female 04/2015   S/P LEEP 08/12/2020    Patient Active Problem List   Diagnosis Date Noted   Prediabetes 07/23/2021   Dyslipidemia (high LDL; low HDL) 07/23/2021   History of cervical dysplasia 06/25/2021   S/P LEEP 08/12/2020   CIN III (cervical intraepithelial neoplasia grade III) with severe dysplasia 06/02/2020   Vitamin D deficiency 03/21/2020   Elevated LDL cholesterol level 03/21/2020   Atypical squamous cells cannot exclude high grade squamous intraepithelial lesion on cytologic  smear of cervix (ASC-H) 03/21/2020   BMI 35.0-35.9,adult 11/27/2017    Past Surgical History:  Procedure Laterality Date   CERVICAL BIOPSY  W/ LOOP ELECTRODE EXCISION     DILATION AND CURETTAGE OF UTERUS      OB History     Gravida  3   Para  2   Term  2   Preterm      AB  1   Living  2      SAB  1   IAB      Ectopic      Multiple      Live Births  2        Obstetric Comments  2010-sab & D&C          Home Medications    Prior to Admission medications   Medication Sig Start Date End Date Taking? Authorizing Provider  hydrOXYzine (ATARAX) 25 MG tablet Take 1 tablet (25 mg total) by mouth every 8 (eight) hours as needed for anxiety. 10/24/21  Yes Shirlee Latch, PA-C  phentermine 37.5 MG capsule Take 1 capsule (37.5 mg total) by mouth every morning. 08/17/21  Yes Hildred Laser, MD  Norgestimate-Ethinyl Estradiol Triphasic (TRI-LO-MARZIA) 0.18/0.215/0.25 MG-25 MCG tab Take 1 tablet by mouth daily. 08/19/21   Hildred Laser, MD    Family History Family History  Problem Relation Age of Onset   Diabetes Mother    Cancer Brother    Breast cancer Neg Hx    Ovarian cancer Neg  Hx    Colon cancer Neg Hx     Social History Social History   Tobacco Use   Smoking status: Never   Smokeless tobacco: Never  Vaping Use   Vaping Use: Never used  Substance Use Topics   Alcohol use: No   Drug use: No     Allergies   Patient has no known allergies.   Review of Systems Review of Systems  Constitutional:  Negative for fatigue.  Respiratory:  Positive for shortness of breath.   Cardiovascular:  Positive for palpitations. Negative for chest pain.  Gastrointestinal:  Negative for abdominal pain, nausea and vomiting.  Neurological:  Negative for dizziness, syncope, weakness, light-headedness and headaches.  Psychiatric/Behavioral:  Negative for dysphoric mood. The patient is nervous/anxious.     Physical Exam Triage Vital Signs ED Triage Vitals  Enc  Vitals Group     BP 10/24/21 1327 (!) 130/99     Pulse Rate 10/24/21 1327 (!) 115     Resp 10/24/21 1327 14     Temp 10/24/21 1327 98.2 F (36.8 C)     Temp Source 10/24/21 1327 Oral     SpO2 10/24/21 1327 100 %     Weight 10/24/21 1325 198 lb (89.8 kg)     Height 10/24/21 1325 5\' 2"  (1.575 m)     Head Circumference --      Peak Flow --      Pain Score 10/24/21 1325 0     Pain Loc --      Pain Edu? --      Excl. in [place]? --    No data found.  Updated Vital Signs BP (!) 130/99 (BP Location: Left Arm)   Pulse (!) 115   Temp 98.2 F (36.8 C) (Oral)   Resp 14   Ht 5\' 2"  (1.575 m)   Wt 198 lb (89.8 kg)   LMP 10/03/2021 (Approximate)   SpO2 100%   BMI 36.21 kg/m       Physical Exam Vitals and nursing note reviewed.  Constitutional:      General: She is not in acute distress.    Appearance: Normal appearance. She is not ill-appearing or toxic-appearing.  HENT:     Head: Normocephalic and atraumatic.     Nose: Nose normal.     Mouth/Throat:     Mouth: Mucous membranes are moist.     Pharynx: Oropharynx is clear.  Eyes:     General: No scleral icterus.       Right eye: No discharge.        Left eye: No discharge.     Conjunctiva/sclera: Conjunctivae normal.  Cardiovascular:     Rate and Rhythm: Regular rhythm. Tachycardia present.     Heart sounds: Normal heart sounds.  Pulmonary:     Effort: Pulmonary effort is normal. No respiratory distress.     Breath sounds: Normal breath sounds.  Musculoskeletal:     Cervical back: Neck supple.  Skin:    General: Skin is dry.  Neurological:     General: No focal deficit present.     Mental Status: She is alert. Mental status is at baseline.     Motor: No weakness.     Gait: Gait normal.  Psychiatric:        Mood and Affect: Mood is depressed.     UC Treatments / Results  Labs (all labs ordered are listed, but only abnormal results are displayed) Labs Reviewed - No data to display  EKG  Radiology No results  found.  Procedures ED EKG  Date/Time: 10/24/2021 2:07 PM Performed by: Shirlee LatchEaves, Waleed Dettman B, PA-C Authorized by: Shirlee LatchEaves, Liberta Gimpel B, PA-C   ECG reviewed by ED Physician in the absence of a cardiologist: yes   Previous ECG:    Previous ECG:  Unavailable Interpretation:    Interpretation: abnormal   Rate:    ECG rate:  117   ECG rate assessment: tachycardic   Rhythm:    Rhythm: sinus rhythm   QRS:    QRS axis:  Normal   QRS intervals:  Normal   QRS conduction: normal   ST segments:    ST segments:  Normal T waves:    T waves: normal   Comments:     Sinus tachycardia, shortened PR interval (including critical care time)  Medications Ordered in UC Medications - No data to display  Initial Impression / Assessment and Plan / UC Course  I have reviewed the triage vital signs and the nursing notes.  Pertinent labs & imaging results that were available during my care of the patient were reviewed by me and considered in my medical decision making (see chart for details).  36 year old female presenting for evaluation after suspected panic attack while driving and [place] traffic 2 and half hours ago which lasted about 20 minutes.  Patient reported shortness of breath, palpitations and dry mouth at the time.  This is happened 1 other time a couple weeks ago.  Of note, new medications patient has started to include phentermine for weight loss.  History of obesity, prediabetes and hyperlipidemia.  Denies any history of cardiopulmonary disease.  No significant family medical history of cardiopulmonary disease or anyone dying young age from heart condition.  Patient is tachycardic at 115 bpm.  BP elevated at 138/99.  Other vital signs normal and stable.  She is overall well-appearing.  Her mood is somewhat depressed.  Heart rhythm is regular.  Chest clear to auscultation.  EKG performed today shows sinus tachycardia and shortened PR interval.  No other EKGs in system.  Patient says she never had a  EKG prior to starting the phentermine.  No known arrhythmias.  Advised patient her symptoms may be due to the phentermine or could be worsened by the medication.  The last time she took the medicine was over 24 hours ago.  Advised her to talk with her provider about a different medication.  Advised her that the shortened PR interval does put her at increased risk for arrhythmia and with the phentermine causing elevated heart rates, I do not think this is the best medication for her to be on.  I did prescribe her hydroxyzine to try for acute anxiety.  Advised that if her symptoms continue despite being off of the phentermine, she may need a referral to cardiology.  Reviewed return and ER precautions.   Final Clinical Impressions(s) / UC Diagnoses   Final diagnoses:  Acute anxiety  Tachycardia  Shortened PR interval     Discharge Instructions      -Your heart rate is elevated today. - As we discussed, there is a slight abnormality in your EKG.  Your PR interval is shorter than normal which puts you at risk of having arrhythmias. - The phentermine medication increases your heart rate and may also put you at risk for arrhythmia since you have this shortened PR interval.  As noted, you have not had panic attacks before you started the phentermine so it could be related to this medicine.  Reach out to your doctor to discuss other options for weight loss.  I would advise discontinuing use of this medication as it is probably contributing to your symptoms. - I have sent hydroxyzine for you to take if you feel particularly stressed. - If you continue to have the symptoms after you discontinue use of the phentermine, you may need a referral to a cardiologist. - If you ever experience symptoms that do not go away within a few minutes or you have chest pain, pass out or have any other severe symptoms, you should call 911 or have someone take you to the ER.     ED Prescriptions     Medication Sig  Dispense Auth. Provider   hydrOXYzine (ATARAX) 25 MG tablet Take 1 tablet (25 mg total) by mouth every 8 (eight) hours as needed for anxiety. 30 tablet Gareth Morgan      PDMP not reviewed this encounter.   Shirlee Latch, PA-C 10/24/21 1411

## 2021-10-24 NOTE — Discharge Instructions (Signed)
-  Your heart rate is elevated today. - As we discussed, there is a slight abnormality in your EKG.  Your PR interval is shorter than normal which puts you at risk of having arrhythmias. - The phentermine medication increases your heart rate and may also put you at risk for arrhythmia since you have this shortened PR interval.  As noted, you have not had panic attacks before you started the phentermine so it could be related to this medicine.  Reach out to your doctor to discuss other options for weight loss.  I would advise discontinuing use of this medication as it is probably contributing to your symptoms. - I have sent hydroxyzine for you to take if you feel particularly stressed. - If you continue to have the symptoms after you discontinue use of the phentermine, you may need a referral to a cardiologist. - If you ever experience symptoms that do not go away within a few minutes or you have chest pain, pass out or have any other severe symptoms, you should call 911 or have someone take you to the ER.

## 2021-10-24 NOTE — ED Triage Notes (Signed)
Patient states that she felt like she was having a panic attack around 11 this morning.  Patient states that she was driving when this occurred.  Patient states that the episode lasted about 20 min.  Patient states that this has happened before.  Patient is currently on Phentermine. Patient did not take it today.

## 2021-10-26 ENCOUNTER — Other Ambulatory Visit
Admission: RE | Admit: 2021-10-26 | Discharge: 2021-10-26 | Disposition: A | Discharge status: Home / Self Care | Payer: Medicaid Other | Source: Ambulatory Visit | Attending: Cardiology | Admitting: Cardiology

## 2021-10-26 ENCOUNTER — Other Ambulatory Visit: Payer: Self-pay

## 2021-10-26 ENCOUNTER — Emergency Department
Admission: EM | Admit: 2021-10-26 | Discharge: 2021-10-27 | Disposition: A | Discharge status: Home / Self Care | Payer: Medicaid Other | Attending: Emergency Medicine | Admitting: Emergency Medicine

## 2021-10-26 ENCOUNTER — Encounter: Payer: Self-pay | Admitting: *Deleted

## 2021-10-26 ENCOUNTER — Emergency Department: Payer: Medicaid Other

## 2021-10-26 DIAGNOSIS — R0602 Shortness of breath: Secondary | ICD-10-CM | POA: Insufficient documentation

## 2021-10-26 DIAGNOSIS — R000 Tachycardia, unspecified: Secondary | ICD-10-CM | POA: Insufficient documentation

## 2021-10-26 DIAGNOSIS — R791 Abnormal coagulation profile: Secondary | ICD-10-CM | POA: Insufficient documentation

## 2021-10-26 DIAGNOSIS — R002 Palpitations: Secondary | ICD-10-CM | POA: Diagnosis not present

## 2021-10-26 LAB — PROTIME-INR
INR: 1 (ref 0.8–1.2)
Prothrombin Time: 13 seconds (ref 11.4–15.2)

## 2021-10-26 LAB — CBC
HCT: 41.3 % (ref 36.0–46.0)
Hemoglobin: 12.9 g/dL (ref 12.0–15.0)
MCH: 29.1 pg (ref 26.0–34.0)
MCHC: 31.2 g/dL (ref 30.0–36.0)
MCV: 93.2 fL (ref 80.0–100.0)
Platelets: 323 10*3/uL (ref 150–400)
RBC: 4.43 MIL/uL (ref 3.87–5.11)
RDW: 14.3 % (ref 11.5–15.5)
WBC: 8.5 10*3/uL (ref 4.0–10.5)
nRBC: 0 % (ref 0.0–0.2)

## 2021-10-26 LAB — TROPONIN I (HIGH SENSITIVITY)
Troponin I (High Sensitivity): 3 ng/L (ref <18)
Troponin I (High Sensitivity): <2 ng/L (ref <18)

## 2021-10-26 LAB — BASIC METABOLIC PANEL
Anion gap: 10 (ref 5–15)
BUN: 12 mg/dL (ref 6–20)
CO2: 23 mmol/L (ref 22–32)
Calcium: 9.1 mg/dL (ref 8.9–10.3)
Chloride: 106 mmol/L (ref 98–111)
Creatinine, Ser: 0.89 mg/dL (ref 0.44–1.00)
GFR, Estimated: >60 mL/min (ref >60)
Glucose, Bld: 124 mg/dL — ABNORMAL HIGH (ref 70–99)
Potassium: 3.5 mmol/L (ref 3.5–5.1)
Sodium: 139 mmol/L (ref 135–145)

## 2021-10-26 LAB — D-DIMER, QUANTITATIVE
D-Dimer, Quant: 0.59 ug/mL-FEU — ABNORMAL HIGH (ref 0.00–0.50)
D-Dimer, Quant: 0.63 ug/mL-FEU — ABNORMAL HIGH (ref 0.00–0.50)

## 2021-10-26 MED ORDER — IOHEXOL 350 MG/ML SOLN
75.0000 mL | Freq: Once | INTRAVENOUS | Status: AC | PRN
  Administered 2021-10-26: 75 mL via INTRAVENOUS

## 2021-10-26 NOTE — ED Triage Notes (Signed)
Pt reports sob.  Pt states she had labs drawn this am and has elevated ddimer.   No c hest pain.  Sx for 3 days.  Pt alert.

## 2021-10-27 NOTE — ED Provider Notes (Signed)
[facility] Provider Note    Event Date/Time   First MD Initiated Contact with Patient 10/26/21 2358     (approximate)   History   Chief Complaint Shortness of Breath and abnormal labs   HPI  Ann Shepherd is a 36 y.o. female with past medical history of hyperlipidemia who presents to the ED complaining of shortness of breath.  Patient reports that she has been dealing with intermittent episodes of shortness of breath and palpitations for the past couple of weeks.  She had an episode 2 days ago that lasted longer than the previous, but did eventually resolve on its own.  She denies any associated fevers, cough, or chest pain.  She was seen by cardiology for this earlier today, found to have elevated D-dimer and referred to the ED for further evaluation.  Patient currently states that she is asymptomatic with no palpitations or shortness of breath.  She denies any prior cardiac history.     Physical Exam   Triage Vital Signs: ED Triage Vitals  Enc Vitals Group     BP 10/26/21 1921 (!) 149/98     Pulse Rate 10/26/21 1921 (!) 113     Resp 10/26/21 1921 20     Temp 10/26/21 1921 98.4 F (36.9 C)     Temp Source 10/26/21 1921 Oral     SpO2 10/26/21 1921 98 %     Weight 10/26/21 1922 198 lb (89.8 kg)     Height 10/26/21 1922 5\' 2"  (1.575 m)     Head Circumference --      Peak Flow --      Pain Score 10/26/21 1922 0     Pain Loc --      Pain Edu? --      Excl. in [place]? --     Most recent vital signs: Vitals:   10/26/21 1921 10/26/21 2104  BP: (!) 149/98 (!) 138/99  Pulse: (!) 113 (!) 108  Resp: 20 19  Temp: 98.4 F (36.9 C) 99 F (37.2 C)  SpO2: 98% 99%    Constitutional: Alert and oriented. Eyes: Conjunctivae are normal. Head: Atraumatic. Nose: No congestion/rhinnorhea. Mouth/Throat: Mucous membranes are moist.  Cardiovascular: Normal rate, regular rhythm. Grossly normal heart sounds.  2+ radial pulses bilaterally. Respiratory:  Normal respiratory effort.  No retractions. Lungs CTAB. Gastrointestinal: Soft and nontender. No distention. Musculoskeletal: No lower extremity tenderness nor edema.  Neurologic:  Normal speech and language. No gross focal neurologic deficits are appreciated.    ED Results / Procedures / Treatments   Labs (all labs ordered are listed, but only abnormal results are displayed) Labs Reviewed  BASIC METABOLIC PANEL - Abnormal; Notable for the following components:      Result Value   Glucose, Bld 124 (*)    All other components within normal limits  D-DIMER, QUANTITATIVE (NOT AT [facility]) - Abnormal; Notable for the following components:   D-Dimer, Quant 0.63 (*)    All other components within normal limits  CBC  PROTIME-INR  TROPONIN I (HIGH SENSITIVITY)  TROPONIN I (HIGH SENSITIVITY)     EKG  ED ECG REPORT I, Blake Divine, the attending physician, personally viewed and interpreted this ECG.   Date: 10/27/2021  EKG Time: 19:27  Rate: 109  Rhythm: sinus tachycardia  Axis: Normal  Intervals:none  ST&T Change: None  RADIOLOGY CTA of chest reviewed and interpreted by me with no large pulmonary embolus or focal infiltrate.  PROCEDURES:  Critical Care performed: No  Procedures   MEDICATIONS ORDERED IN ED: Medications  iohexol (OMNIPAQUE) 350 MG/ML injection 75 mL (75 mLs Intravenous Contrast Given 10/26/21 2127)     IMPRESSION / MDM / ASSESSMENT AND PLAN / ED COURSE  I reviewed the triage vital signs and the nursing notes.                              36 y.o. female with past medical history of hyperlipidemia presents to the ED complaining of intermittent palpitations and shortness of breath for the past couple of weeks, currently asymptomatic but sent to the ED for further evaluation due to elevated D-dimer.  Patient's presentation is most consistent with acute complicated illness / injury requiring diagnostic workup.  Differential diagnosis includes, but is not  limited to, PE, ACS, pneumonia, pneumothorax, arrhythmia, anxiety, electrolyte abnormality.  Patient well-appearing and in no acute distress, vital signs are unremarkable and patient states she is currently asymptomatic.  EKG shows sinus tachycardia with no evidence of arrhythmia or ischemia, 2 sets of troponin are negative and I doubt ACS.  She again has elevated D-dimer here in the ED, however CTA is negative for PE or other acute finding.  Remainder of labs are reassuring with no anemia or electrolyte abnormality.  Patient is appropriate for discharge home and has heart monitor in place, was counseled to follow-up with cardiology and return to the ED for new or worsening symptoms.  Patient agrees with plan.      FINAL CLINICAL IMPRESSION(S) / ED DIAGNOSES   Final diagnoses:  Shortness of breath  Palpitations     Rx / DC Orders   ED Discharge Orders     None        Note:  This document was prepared using Dragon voice recognition software and may include unintentional dictation errors.   Blake Divine, MD 10/27/21 [phone]

## 2021-11-10 ENCOUNTER — Encounter: Payer: Self-pay | Admitting: Obstetrics and Gynecology

## 2021-11-10 ENCOUNTER — Ambulatory Visit (INDEPENDENT_AMBULATORY_CARE_PROVIDER_SITE_OTHER): Payer: Self-pay | Admitting: Obstetrics and Gynecology

## 2021-11-10 VITALS — BP 120/79 | HR 95 | Resp 16 | Ht 63.0 in | Wt 201.2 lb

## 2021-11-10 DIAGNOSIS — Z7689 Persons encountering health services in other specified circumstances: Secondary | ICD-10-CM

## 2021-11-10 DIAGNOSIS — R0989 Other specified symptoms and signs involving the circulatory and respiratory systems: Secondary | ICD-10-CM

## 2021-11-10 DIAGNOSIS — R7303 Prediabetes: Secondary | ICD-10-CM

## 2021-11-10 DIAGNOSIS — E669 Obesity, unspecified: Secondary | ICD-10-CM

## 2021-11-10 DIAGNOSIS — E785 Hyperlipidemia, unspecified: Secondary | ICD-10-CM

## 2021-11-10 NOTE — Progress Notes (Signed)
    GYNECOLOGY PROGRESS NOTE  Subjective:    Patient ID: Ann Shepherd, female    DOB: 23-Oct-1985, 36 y.o.   MRN: [id number]  HPI  Patient is a 36 y.o. female who presents for 11 month weight management follow up. She has a past history of obesity, prediabetes and dyslipidemia. She initiated use of Phentermine 11 weeks ago.  Denies any undesirable side effects and reports compliance with medications. She stopped taking her Phentermine because on May 28 she had an episode of tachycardia, sob, and tingling in her hands. She was unable to catch her breath. She said she has had several similar episodes, but none of them was ever as bad as this one. She is being monitored by a cardiologist. Was wearing a heart monitor, just waiting on the results to come back now. She wants to find out why and what caused this to happen so she can start back taking her Phentermine.  Starting weight in March was 208 lbs.  She also notes that she spoke with a Neurologist in [place] (telehealth), who evaluated her for anxiety and notes that she did screen positive (thinking panic attack). Was prescribed medication (Xanax, Escitalopram, and hydroxyzine).      Current interventions:  1. Diet - She continue to cut back on fast food consumption. She feels that she is eating less, because she is getting full faster than usual. She is eating more vegetables, especially carrots. Has cut out sodas.  2. Activity - She exercises 30-40 minutes daily, mostly walking/jogging. 3. Reports bowel movements are normal.    The following portions of the patient's history were reviewed and updated as appropriate: allergies, current medications, past family history, past medical history, past social history, past surgical history, and problem list.  Review of Systems Pertinent items are noted in HPI.   Objective:    Wt Readings from Last 3 Encounters:  11/10/21 201 lb 3.2 oz (91.3 kg)  10/26/21 198 lb (89.8 kg)  10/24/21 198 lb  (89.8 kg)    BP Readings from Last 3 Encounters:  11/10/21 120/79  10/27/21 (!) 132/92  10/24/21 (!) 130/99   Pulse Readings from Last 3 Encounters:  11/10/21 95  10/27/21 88  10/24/21 (!) 115     General appearance: alert and no distress Abdomen: soft, non-tender.  Waist circumference 45 in.    Labs:   Assessment:   Weight management Obesity, Body mass index is 35.64 kg/m. Prediabetes Dyslipidemia Cardiac symptoms   Plan:   Weight management  - has had to hold Phentermine due to recent cardiac issues, unclear if they are related to Phentermine.  Continue to hold at this time. Will also look into other options (however may be limited as patient is self-pay).  Pre-diabetes, dyslipidemia, trying to manage with lifestyle  and dietary changes.    Hildred Laser, MD [facility]

## 2021-12-02 ENCOUNTER — Encounter: Payer: Self-pay | Admitting: Obstetrics and Gynecology

## 2021-12-02 ENCOUNTER — Ambulatory Visit (INDEPENDENT_AMBULATORY_CARE_PROVIDER_SITE_OTHER): Payer: Self-pay | Admitting: Obstetrics and Gynecology

## 2021-12-02 VITALS — BP 118/83 | HR 89 | Resp 15 | Ht 62.0 in | Wt 201.0 lb

## 2021-12-02 DIAGNOSIS — I471 Supraventricular tachycardia: Secondary | ICD-10-CM

## 2021-12-02 DIAGNOSIS — E669 Obesity, unspecified: Secondary | ICD-10-CM

## 2021-12-02 DIAGNOSIS — E785 Hyperlipidemia, unspecified: Secondary | ICD-10-CM

## 2021-12-02 DIAGNOSIS — R7303 Prediabetes: Secondary | ICD-10-CM

## 2021-12-02 DIAGNOSIS — Z7689 Persons encountering health services in other specified circumstances: Secondary | ICD-10-CM

## 2021-12-02 NOTE — Patient Instructions (Signed)
Naltrexone; Bupropion Extended-Release Tablets What is this medication? NALTREXONE; BUPROPION (nal TREX one; byoo PROE pee on) promotes weight loss. It may also be used to maintain weight loss. It works by decreasing appetite. Changes to diet and exercise are often combined with this medication. This medicine may be used for other purposes; ask your health care provider or pharmacist if you have questions. COMMON BRAND NAME(S): Contrave What should I tell my care team before I take this medication? They need to know if you have any of these conditions: An eating disorder, such as anorexia or bulimia Diabetes Depression Frequently drink alcohol Glaucoma Head injury Heart disease High blood pressure History of a tumor or infection of your brain or spine History of heart attack or stroke History of irregular heartbeat History of substance use disorder or alcohol use disorder Kidney disease Liver disease Low levels of sodium in the blood Mental health condition Seizures Suicidal thoughts, plans, or attempt by you or a family member Taken an MAOI like Carbex, Eldepryl, Marplan, Nardil, or Parnate in last 14 days An unusual or allergic reaction to bupropion, naltrexone, other medications, foods, dyes, or preservatives Breast-feeding Pregnant or trying to become pregnant How should I use this medication? Take this medication by mouth with a full glass of water. Take it as directed on the prescription label at the same time every day. Do not cut, crush, or chew this medication. Swallow the tablets whole. You can take it with or without food. Do not take with high-fat meals as this may increase your risk of seizures. Keep taking it unless your care team tells you to stop. A special MedGuide will be given to you by the pharmacist with each prescription and refill. Be sure to read this information carefully each time. Talk to your care team about the use of this medication in children. Special  care may be needed. Overdosage: If you think you have taken too much of this medicine contact a poison control center or emergency room at once. NOTE: This medicine is only for you. Do not share this medicine with others. What if I miss a dose? If you miss a dose, skip it. Take your next dose at the normal time. Do not take extra or 2 doses at the same time to make up for the missed dose. What may interact with this medication? Do not take this medication with any of the following: Any medications used to stop taking opioids, such as methadone or buprenorphine Linezolid MAOIs, such as Carbex, Eldepryl, Marplan, Nardil, and Parnate Methylene blue (injected into a vein) Opioid medications Other medications that contain bupropion, such as Zyban or Wellbutrin This medication may also interact with the following: Alcohol Certain medications for blood pressure, such as metoprolol, propranolol Certain medications for depression, anxiety, or mental health conditions Certain medications for HIV or hepatitis Certain medications for irregular heart beat, such as propafenone, flecainide Certain medications for Parkinson disease, such as amantadine, levodopa Certain medications for seizures, such as carbamazepine, phenytoin, phenobarbital Certain medications for sleep Cimetidine Clopidogrel Cyclophosphamide Digoxin Disulfiram Furazolidone Isoniazid Nicotine Orphenadrine Procarbazine Steroid medications, such as prednisone or cortisone Stimulant medications for attention disorders, weight loss, or to stay awake Tamoxifen Theophylline Thiotepa Ticlopidine Tramadol Warfarin This list may not describe all possible interactions. Give your health care provider a list of all the medicines, herbs, non-prescription drugs, or dietary supplements you use. Also tell them if you smoke, drink alcohol, or use illegal drugs. Some items may interact with your medicine. What  should I watch for while using  this medication? Visit your care team for regular checks on your progress. This medication may cause serious skin reactions. They can happen weeks to months after starting the medication. Contact your care team right away if you notice fevers or flu-like symptoms with a rash. The rash may be red or purple and then turn into blisters or peeling of the skin. Or, you might notice a red rash with swelling of the face, lips, or lymph nodes in your neck or under your arms. This medication may affect blood sugar. Ask your care team if changes in diet or medications are needed if you have diabetes. Patients and their families should watch out for new or worsening depression or thoughts of suicide. This includes sudden changes in mood, behaviors, or thoughts. These changes can happen at any time but are more common in the beginning of treatment or after a change in dose. Call your care team right away if you experience these thoughts or worsening depression. Avoid alcoholic drinks while taking this medication. Drinking large amounts of alcoholic beverages, using sleeping or anxiety medications, or quickly stopping the use of these agents while taking this medication may increase your risk for a seizure. Do not drive or use heavy machinery until you know how this medication affects you. This medication can impair your ability to perform these tasks. Inform your care team if you wish to become pregnant or think you might be pregnant. Losing weight while pregnant is not advised and may cause harm to the unborn child. Talk to your care team for more information. What side effects may I notice from receiving this medication? Side effects that you should report to your care team as soon as possible: Allergic reactions--skin rash, itching, hives, swelling of the face, lips, tongue, or throat Fast or irregular heartbeat Increase in blood pressure Liver injury--right upper belly pain, loss of appetite, nausea,  light-colored stool, dark yellow or brown urine, yellowing skin or eyes, unusual weakness or fatigue Mood and behavior changes--anxiety, nervousness, confusion, hallucinations, irritability, hostility, thoughts of suicide or self-harm, worsening mood, feelings of depression Redness, blistering, peeling, or loosening of the skin, including inside the mouth Seizures Sudden eye pain or change in vision such as blurry vision, seeing halos around lights, vision loss Side effects that usually do not require medical attention (report to your care team if they continue or are bothersome): Constipation Dizziness Dry mouth Fatigue Headache Nausea Trouble sleeping Vomiting This list may not describe all possible side effects. Call your doctor for medical advice about side effects. You may report side effects to FDA at 1-800-FDA-1088. Where should I keep my medication? Keep out of the reach of children and pets. Store between 15 and 30 degrees C (59 and 86 degrees F). Get rid of any unused medication after the expiration date. To get rid of medications that are no longer needed or have expired: Take the medication to a medication take-back program. Check with your pharmacy or law enforcement to find a location. If you cannot return the medication, check the label or package insert to see if the medication should be thrown out in the garbage or flushed down the toilet. If you are not sure, ask your care team. If it is safe to put it in the trash, pour the medication out of the container. Mix the medication with cat litter, dirt, coffee grounds, or other unwanted substance. Seal the mixture in a bag or container. Put it in  the trash. NOTE: This sheet is a summary. It may not cover all possible information. If you have questions about this medicine, talk to your doctor, pharmacist, or health care provider.  2023 Elsevier/Gold Standard (2021-07-12 00:00:00)  

## 2021-12-02 NOTE — Progress Notes (Signed)
    GYNECOLOGY PROGRESS NOTE  Subjective:    Patient ID: Ann Shepherd, female    DOB: Aug 16, 1985, 36 y.o.   MRN: [id number]  HPI  Patient is a 36 y.o. female who presents foreweight management follow up. She has a past history of obesity, prediabetes and dyslipidemia. She initiated use of  Phentermine for 3 months but then held in May due to having an episode of tachycardia, SOB, and hand tingling. Has undergone evaluation with Cardiology recently, including Holter monitoring. Was initiated on Metoprolol for persistent elevated heart rate. Was also prescribed medication for anxiety for possible panic attack by a doctor in [place], which she also just initiated.  Denies any undesirable side effects and reports compliance with medications.  Starting weight in March was 208 lbs prior to initiation of medication.    Current interventions:  1. Diet -patient continues to adopt more of a Mediterranean diet, trying to eat more fish and lean meats with veggies.  2. Activity -is walking. 3. Reports bowel movements are normal.    The following portions of the patient's history were reviewed and updated as appropriate: allergies, current medications, past family history, past medical history, past social history, past surgical history, and problem list.  Review of Systems Pertinent items noted in HPI and remainder of comprehensive ROS otherwise negative.   Objective:       12/02/2021    1:52 PM 11/10/2021    9:54 AM 10/27/2021   12:57 AM  Vitals with BMI  Height 5\' 2"  5\' 3"    Weight 201 lbs 201 lbs 3 oz   BMI 36.75 35.65   Systolic 118 120  Diastolic 83 79 92  Pulse 89 95 88    General appearance: alert and no distress Abdomen: soft, non-tender.  Waist circumference 45 in.    Labs:   Assessment:   1. Encounter for weight management   2. Obesity (BMI 35.0-39.9 without comorbidity)   3. Prediabetes   4. Dyslipidemia (high LDL; low HDL)   5. SVT (supraventricular tachycardia)  (HCC)     Plan:   Weight management  -patient desires to resume weight management, wants to discuss her options.  Options are limited as patient is currently without insurance and most medications are fairly expensive.  I discussed the option of resuming her phentermine at a lower dose (I.e. 15 mg)  as to not trigger her SVT as she is now on metoprolol and has been ruled out for other cardiac events.  I also discussed the option of trial of Contrave as we have samples in the office, and patient is working to get insurance coverage through the 619.  Patient okay to trial the Contrave.  Will prescribe and patient to follow-up in 1 month. Prediabetes and dyslipidemia -we will follow-up levels once further weight loss has occurred.   SVT -continue with metoprolol for now.  Patient to be followed up with cardiology as instructed.    04-29-1990, MD [facility]

## 2021-12-30 ENCOUNTER — Encounter: Payer: Self-pay | Admitting: Obstetrics and Gynecology

## 2022-01-05 ENCOUNTER — Encounter: Payer: Medicaid Other | Admitting: Obstetrics and Gynecology

## 2022-03-30 ENCOUNTER — Ambulatory Visit
Admission: EM | Admit: 2022-03-30 | Discharge: 2022-03-30 | Disposition: A | Discharge status: Home / Self Care | Payer: 59 | Attending: Family Medicine | Admitting: Family Medicine

## 2022-03-30 ENCOUNTER — Encounter: Payer: Self-pay | Admitting: Emergency Medicine

## 2022-03-30 DIAGNOSIS — J209 Acute bronchitis, unspecified: Secondary | ICD-10-CM

## 2022-03-30 MED ORDER — ALBUTEROL SULFATE HFA 108 (90 BASE) MCG/ACT IN AERS: 2.0000 | INHALATION_SPRAY | RESPIRATORY_TRACT | 0 refills | Status: AC | PRN

## 2022-03-30 MED ORDER — AZITHROMYCIN 250 MG PO TABS: 250.0000 mg | ORAL_TABLET | Freq: Every day | ORAL | 0 refills | Status: DC

## 2022-03-30 MED ORDER — PREDNISONE 20 MG PO TABS: 40.0000 mg | ORAL_TABLET | Freq: Every day | ORAL | 0 refills | Status: AC

## 2022-03-30 NOTE — ED Triage Notes (Signed)
Pt c/o cough, nasal congestion. Started about a week and half ago. Denies fever.

## 2022-03-30 NOTE — Discharge Instructions (Signed)
Stop by the pharmacy to pick up your prescriptions. You are being treated for bronchitis.    You can take Tylenol and/or Ibuprofen as needed for fever reduction and pain relief.    For cough: honey 1/2 to 1 teaspoon (you can dilute the honey in water or another fluid).  You can also use guaifenesin and dextromethorphan for cough. You can use a humidifier for chest congestion and cough.  If you don't have a humidifier, you can sit in the bathroom with the hot shower running.      For sore throat: try warm salt water gargles, cepacol lozenges, throat spray, warm tea or water with lemon/honey, popsicles or ice, or OTC cold relief medicine for throat discomfort.    For congestion: take a daily anti-histamine like Zyrtec, Claritin, and a oral decongestant, such as pseudoephedrine.  You can also use Flonase 1-2 sprays in each nostril daily.    It is important to stay hydrated: drink plenty of fluids (water, gatorade/powerade/pedialyte, juices, or teas) to keep your throat moisturized and help further relieve irritation/discomfort.    Return or go to the Emergency Department if symptoms worsen or do not improve in the next few days

## 2022-03-30 NOTE — ED Provider Notes (Signed)
[facility]    CSN: [id number] Arrival date & time: 03/30/22  1329      History   Chief Complaint Chief Complaint  Patient presents with   Cough    HPI Ann Shepherd is a 36 y.o. female.   HPI   Ann Shepherd presents for cough for the past 1-2 weeks. She has clear sputum without blood. No one else has similar sx. She endorses nasal congestion. Denies chest pain, shortness of breath, fever, chills, belly, nausea, vomiting, diarrhea, rash and headache. Had a scratchy throat due to coughing. No pain with swallowing. She took NyQuil without relief.  Her job told her to get checked out.       Past Medical History:  Diagnosis Date   HPV in female 04/2015   S/P LEEP 08/12/2020    Patient Active Problem List   Diagnosis Date Noted   Prediabetes 07/23/2021   Dyslipidemia (high LDL; low HDL) 07/23/2021   History of cervical dysplasia 06/25/2021   S/P LEEP 08/12/2020   CIN III (cervical intraepithelial neoplasia grade III) with severe dysplasia 06/02/2020   Vitamin D deficiency 03/21/2020   Elevated LDL cholesterol level 03/21/2020   Atypical squamous cells cannot exclude high grade squamous intraepithelial lesion on cytologic smear of cervix (ASC-H) 03/21/2020   BMI 35.0-35.9,adult 11/27/2017    Past Surgical History:  Procedure Laterality Date   CERVICAL BIOPSY  W/ LOOP ELECTRODE EXCISION     DILATION AND CURETTAGE OF UTERUS      OB History     Gravida  3   Para  2   Term  2   Preterm      AB  1   Living  2      SAB  1   IAB      Ectopic      Multiple      Live Births  2        Obstetric Comments  2010-sab & D&C          Home Medications    Prior to Admission medications   Medication Sig Start Date End Date Taking? Authorizing Provider  albuterol (VENTOLIN HFA) 108 (90 Base) MCG/ACT inhaler Inhale 2 puffs into the lungs every 4 (four) hours as needed. 03/30/22  Yes Ogechi Kuehnel, DO  azithromycin (ZITHROMAX Z-PAK) 250 MG  tablet Take 1 tablet (250 mg total) by mouth daily. Take 2 tablets on day 1. 03/30/22  Yes Janet Humphreys, DO  citalopram (CELEXA) 10 MG tablet Take 1 tablet by mouth daily. 03/08/22 03/08/23 Yes [provider]  metoprolol succinate (TOPROL-XL) 25 MG 24 hr tablet Take 25 mg by mouth daily. 10/26/21  Yes [provider]  Norgestimate-Ethinyl Estradiol Triphasic (TRI-LO-MARZIA) 0.18/0.215/0.25 MG-25 MCG tab Take 1 tablet by mouth daily. 08/19/21  Yes Hildred Laser, MD  predniSONE (DELTASONE) 20 MG tablet Take 2 tablets (40 mg total) by mouth daily for 5 days. 03/30/22 04/04/22 Yes Katha Cabal, DO    Family History Family History  Problem Relation Age of Onset   Diabetes Mother    Cancer Brother    Breast cancer Neg Hx    Ovarian cancer Neg Hx    Colon cancer Neg Hx     Social History Social History   Tobacco Use   Smoking status: Never   Smokeless tobacco: Never  Vaping Use   Vaping Use: Never used  Substance Use Topics   Alcohol use: No   Drug use: No     Allergies  Patient has no known allergies.   Review of Systems Review of Systems: negative unless otherwise stated in HPI.      Physical Exam Triage Vital Signs ED Triage Vitals  Enc Vitals Group     BP 03/30/22 1401 112/81     Pulse Rate 03/30/22 1401 93     Resp 03/30/22 1401 16     Temp 03/30/22 1401 98.6 F (37 C)     Temp Source 03/30/22 1401 Oral     SpO2 03/30/22 1401 98 %     Weight 03/30/22 1359 201 lb 1 oz (91.2 kg)     Height 03/30/22 1359 5\' 2"  (1.575 m)     Head Circumference --      Peak Flow --      Pain Score 03/30/22 1358 0     Pain Loc --      Pain Edu? --      Excl. in GC? --    No data found.  Updated Vital Signs BP 112/81 (BP Location: Left Arm)   Pulse 93   Temp 98.6 F (37 C) (Oral)   Resp 16   Ht 5\' 2"  (1.575 m)   Wt 91.2 kg   LMP 03/09/2022 (Approximate)   SpO2 98%   BMI 36.77 kg/m   Visual Acuity Right Eye Distance:   Left Eye Distance:    Bilateral Distance:    Right Eye Near:   Left Eye Near:    Bilateral Near:     Physical Exam GEN:     alert, non-toxic appearing female in no distress     HENT:  mucus membranes moist, oropharyngeal  without lesions or  exudate, no  tonsillar hypertrophy,   mild oropharyngeal erythema , no nasal discharge,  bilateral TM  normal EYES:   pupils equal and reactive, EOMi ,  no scleral injection NECK:  normal ROM, no  lymphadenopathy RESP:  no increased work of breathing,  clear to auscultation bilaterally CVS:   regular rate  and rhythm Skin:   warm and dry, no rash on visible skin , normal  skin turgor    UC Treatments / Results  Labs (all labs ordered are listed, but only abnormal results are displayed) Labs Reviewed - No data to display  EKG   Radiology No results found.  Procedures Procedures (including critical care time)  Medications Ordered in UC Medications - No data to display  Initial Impression / Assessment and Plan / UC Course  I have reviewed the triage vital signs and the nursing notes.  Pertinent labs & imaging results that were available during my care of the patient were reviewed by me and considered in my medical decision making (see chart for details).       Pt is a 36 y.o. female who presents for 2 weeks of respiratory symptoms. Ann Shepherd is  afebrile here without recent antipyretics. Satting well on room air. Overall pt is  well appearing, well hydrated, without respiratory distress. Pulmonary exam  is unremarkable.  COVID  and influenza testing deferred due to duration of symptoms. Treat for presumed bacterial bronchitis with steroids and antibiotics as below. Given albuterol inhaler for bronchospasm. Typical duration of symptoms discussed.  Return and ED precautions given and patient voiced understanding.  Discussed MDM, treatment plan and plan for follow-up with patient who agrees with plan.     Final Clinical Impressions(s) / UC Diagnoses   Final  diagnoses:  Acute bronchitis, unspecified organism   Discharge Instructions  None    ED Prescriptions     Medication Sig Dispense Auth. Provider   predniSONE (DELTASONE) 20 MG tablet Take 2 tablets (40 mg total) by mouth daily for 5 days. 10 tablet Brihana Quickel, DO   albuterol (VENTOLIN HFA) 108 (90 Base) MCG/ACT inhaler Inhale 2 puffs into the lungs every 4 (four) hours as needed. 6.7 g Corneshia Hines, DO   azithromycin (ZITHROMAX Z-PAK) 250 MG tablet Take 1 tablet (250 mg total) by mouth daily. Take 2 tablets on day 1. 6 tablet Shanena Pellegrino, DO      PDMP not reviewed this encounter.   Lyndee Hensen, DO 03/30/22 1427

## 2022-07-11 NOTE — Progress Notes (Unsigned)
    GYNECOLOGY PROGRESS NOTE  Subjective:    Patient ID: Ann Shepherd, female    DOB: 1986-01-17, 37 y.o.   MRN: [id number]  HPI  Patient is a 37 y.o. G49P2012 female who presents to discuss oral contraception.  {Common ambulatory SmartLinks:19316}  Review of Systems {ros; complete:30496}   Objective:   There were no vitals taken for this visit. There is no height or weight on file to calculate BMI. General appearance: {general exam:16600} Abdomen: {abdominal exam:16834} Pelvic: {pelvic exam:16852::"cervix normal in appearance","external genitalia normal","no adnexal masses or tenderness","no cervical motion tenderness","rectovaginal septum normal","uterus normal size, shape, and consistency","vagina normal without discharge"} Extremities: {extremity exam:5109} Neurologic: {neuro exam:17854}   Assessment:   No diagnosis found.   Plan:   There are no diagnoses linked to this encounter.    Rubie Maid, MD [place]

## 2022-07-13 ENCOUNTER — Encounter: Payer: Self-pay | Admitting: Obstetrics and Gynecology

## 2022-07-13 ENCOUNTER — Ambulatory Visit (INDEPENDENT_AMBULATORY_CARE_PROVIDER_SITE_OTHER): Payer: BLUE CROSS/BLUE SHIELD | Admitting: Obstetrics and Gynecology

## 2022-07-13 VITALS — BP 125/92 | HR 94 | Wt 212.8 lb

## 2022-07-13 DIAGNOSIS — R7303 Prediabetes: Secondary | ICD-10-CM

## 2022-07-13 DIAGNOSIS — Z3041 Encounter for surveillance of contraceptive pills: Secondary | ICD-10-CM

## 2022-07-13 DIAGNOSIS — E669 Obesity, unspecified: Secondary | ICD-10-CM

## 2022-07-13 DIAGNOSIS — E785 Hyperlipidemia, unspecified: Secondary | ICD-10-CM

## 2022-07-13 MED ORDER — NORGESTIM-ETH ESTRAD TRIPHASIC 0.18/0.215/0.25 MG-25 MCG PO TABS: 1.0000 | ORAL_TABLET | Freq: Every day | ORAL | 3 refills | Status: DC

## 2022-10-14 NOTE — Progress Notes (Deleted)
GYNECOLOGY ANNUAL PHYSICAL EXAM PROGRESS NOTE  Subjective:    Ann Shepherd is a 37 y.o. [id number] female who presents for an annual exam. The patient has no complaints today. The patient {is/is not/has never been:13135} sexually active. The patient participates in regular exercise: {yes/no/not asked:9010}. Has the patient ever been transfused or tattooed?: {yes/no/not asked:9010}. The patient reports that there {is/is not:9024} domestic violence in her life.    Menstrual History: Menarche age: [age] No LMP recorded.     Gynecologic History:  Contraception: OCP (estrogen/progesterone) History of STI's: HPV Last Pap: 06/25/2021. Results were: abnormal. Notes h/o abnormal pap smears. Last mammogram: Not age appropriate      OB History  Gravida Para Term Preterm AB Living  3 2 2  0 1 2  SAB IAB Ectopic Multiple Live Births  1 0 0 0 2    # Outcome Date GA Lbr Len/2nd Weight Sex Delivery Anes PTL Lv  3 Term 12/01/10 [redacted]w[redacted]d  6 lb 3.2 oz (2.812 kg) F Vag-Spont  N LIV     Name: Ann Shepherd  2 SAB 2010 [redacted]w[redacted]d       ND  1 Term 09/26/06 [redacted]w[redacted]d  5 lb 9.6 oz (2.54 kg) F Vag-Spont None N LIV     Name: [facility]    Obstetric Comments  2010-sab & D&C    Past Medical History:  Diagnosis Date   HPV in female 04/2015   S/P LEEP 08/12/2020    Past Surgical History:  Procedure Laterality Date   CERVICAL BIOPSY  W/ LOOP ELECTRODE EXCISION     DILATION AND CURETTAGE OF UTERUS      Family History  Problem Relation Age of Onset   Diabetes Mother    Cancer Brother    Breast cancer Neg Hx    Ovarian cancer Neg Hx    Colon cancer Neg Hx     Social History   Socioeconomic History   Marital status: Single    Spouse name: Not on file   Number of children: Not on file   Years of education: Not on file   Highest education level: Not on file  Occupational History   Not on file  Tobacco Use   Smoking status: Never   Smokeless tobacco: Never  Vaping Use   Vaping Use: Never used   Substance and Sexual Activity   Alcohol use: No   Drug use: No   Sexual activity: Not Currently    Partners: Male    Birth control/protection: Pill  Other Topics Concern   Not on file  Social History Narrative   Not on file   Social Determinants of Health   Financial Resource Strain: Not on file  Food Insecurity: Not on file  Transportation Needs: Not on file  Physical Activity: Not on file  Stress: Not on file  Social Connections: Not on file  Intimate Partner Violence: Not on file    Current Outpatient Medications on File Prior to Visit  Medication Sig Dispense Refill   albuterol (VENTOLIN HFA) 108 (90 Base) MCG/ACT inhaler Inhale 2 puffs into the lungs every 4 (four) hours as needed. 6.7 g 0   citalopram (CELEXA) 10 MG tablet Take 1 tablet by mouth daily.     Norgestimate-Ethinyl Estradiol Triphasic (TRI-LO-MARZIA) 0.18/0.215/0.25 MG-25 MCG tab Take 1 tablet by mouth daily. 84 tablet 3   No current facility-administered medications on file prior to visit.    No Known Allergies   Review of Systems Constitutional: negative for chills, fatigue,  fevers and sweats Eyes: negative for irritation, redness and visual disturbance Ears, nose, mouth, throat, and face: negative for hearing loss, nasal congestion, snoring and tinnitus Respiratory: negative for asthma, cough, sputum Cardiovascular: negative for chest pain, dyspnea, exertional chest pressure/discomfort, irregular heart beat, palpitations and syncope Gastrointestinal: negative for abdominal pain, change in bowel habits, nausea and vomiting Genitourinary: negative for abnormal menstrual periods, genital lesions, sexual problems and vaginal discharge, dysuria and urinary incontinence Integument/breast: negative for breast lump, breast tenderness and nipple discharge Hematologic/lymphatic: negative for bleeding and easy bruising Musculoskeletal:negative for back pain and muscle weakness Neurological: negative for  dizziness, headaches, vertigo and weakness Endocrine: negative for diabetic symptoms including polydipsia, polyuria and skin dryness Allergic/Immunologic: negative for hay fever and urticaria      Objective:  There were no vitals taken for this visit. There is no height or weight on file to calculate BMI.    General Appearance:    Alert, cooperative, no distress, appears stated age  Head:    Normocephalic, without obvious abnormality, atraumatic  Eyes:    PERRL, conjunctiva/corneas clear, EOM's intact, both eyes  Ears:    Normal external ear canals, both ears  Nose:   Nares normal, septum midline, mucosa normal, no drainage or sinus tenderness  Throat:   Lips, mucosa, and tongue normal; teeth and gums normal  Neck:   Supple, symmetrical, trachea midline, no adenopathy; thyroid: no enlargement/tenderness/nodules; no carotid bruit or JVD  Back:     Symmetric, no curvature, ROM normal, no CVA tenderness  Lungs:     Clear to auscultation bilaterally, respirations unlabored  Chest Wall:    No tenderness or deformity   Heart:    Regular rate and rhythm, S1 and S2 normal, no murmur, rub or gallop  Breast Exam:    No tenderness, masses, or nipple abnormality  Abdomen:     Soft, non-tender, bowel sounds active all four quadrants, no masses, no organomegaly.    Genitalia:    Pelvic:external genitalia normal, vagina without lesions, discharge, or tenderness, rectovaginal septum  normal. Cervix normal in appearance, no cervical motion tenderness, no adnexal masses or tenderness.  Uterus normal size, shape, mobile, regular contours, nontender.  Rectal:    Normal external sphincter.  No hemorrhoids appreciated. Internal exam not done.   Extremities:   Extremities normal, atraumatic, no cyanosis or edema  Pulses:   2+ and symmetric all extremities  Skin:   Skin color, texture, turgor normal, no rashes or lesions  Lymph nodes:   Cervical, supraclavicular, and axillary nodes normal  Neurologic:   CNII-XII  intact, normal strength, sensation and reflexes throughout   .  Labs:  Lab Results  Component Value Date   WBC 8.5 10/26/2021   HGB 12.9 10/26/2021   HCT 41.3 10/26/2021   MCV 93.2 10/26/2021   PLT 323 10/26/2021    Lab Results  Component Value Date   CREATININE 0.89 10/26/2021   BUN 12 10/26/2021   NA 139 10/26/2021   K 3.5 10/26/2021   CL 106 10/26/2021   CO2 23 10/26/2021    Lab Results  Component Value Date   ALT 13 06/25/2021   AST 12 06/25/2021   ALKPHOS 90 06/25/2021   BILITOT <0.2 06/25/2021    Lab Results  Component Value Date   TSH 2.920 06/25/2021     Assessment:   No diagnosis found.   Plan:  Blood tests: {blood tests:13147}. Breast self exam technique reviewed and patient encouraged to perform self-exam monthly. Contraception: OCP (estrogen/progesterone).  Discussed healthy lifestyle modifications. Mammogram  Not age appropriate Pap smear  UTD . COVID vaccination status: Follow up in 1 year for annual exam   Hildred Laser, MD [facility]

## 2022-10-17 ENCOUNTER — Telehealth: Payer: Self-pay

## 2022-10-17 NOTE — Telephone Encounter (Signed)
Ann Shepherd called into triage line, she had to reschedule her pap and birth control follow up because she was currently on her cycle. They moved her appointment until Ann Shepherd was worried she wouldn't get her birth control. I advised Ann Shepherd to just call in at the end of each month and we will send in one refill to hold her over until she is seen. Patient understood.

## 2022-10-18 ENCOUNTER — Ambulatory Visit: Payer: BLUE CROSS/BLUE SHIELD | Admitting: Obstetrics and Gynecology

## 2022-10-18 DIAGNOSIS — Z01419 Encounter for gynecological examination (general) (routine) without abnormal findings: Secondary | ICD-10-CM

## 2022-10-18 DIAGNOSIS — E785 Hyperlipidemia, unspecified: Secondary | ICD-10-CM

## 2022-10-18 DIAGNOSIS — E669 Obesity, unspecified: Secondary | ICD-10-CM

## 2022-10-18 DIAGNOSIS — R7303 Prediabetes: Secondary | ICD-10-CM

## 2023-01-13 ENCOUNTER — Telehealth: Payer: Self-pay | Admitting: Obstetrics and Gynecology

## 2023-01-13 MED ORDER — NORGESTIM-ETH ESTRAD TRIPHASIC 0.18/0.215/0.25 MG-25 MCG PO TABS
1.0000 | ORAL_TABLET | Freq: Every day | ORAL | 0 refills | Status: DC
Start: 2023-01-13 — End: 2023-05-16

## 2023-01-13 NOTE — Telephone Encounter (Signed)
I contacted the patient via phone x2 for rescheduling for Tuesday, 8/20 with Dr. Valentino Saxon out of office. Dr. Oretha Milch  remaining schedule hasn't been released for September. The patient requested to be placed on call list for scheduling with Dr. Valentino Saxon. We will contact her for the rescheduling this appointment. She is also needing an refill on her birth control. [facility], [address]. Please advise?

## 2023-01-17 ENCOUNTER — Ambulatory Visit: Payer: BLUE CROSS/BLUE SHIELD | Admitting: Obstetrics and Gynecology

## 2023-03-20 NOTE — Progress Notes (Unsigned)
GYNECOLOGY ANNUAL PHYSICAL EXAM PROGRESS NOTE  Subjective:    Ann Shepherd is a 37 y.o. [phone] female who presents for an annual exam.   She has a history significant for CIN III, s/p LEEP in 06/2020. The patient has no complaints today. The patient is sexually active (1 partner). The patient participates in regular exercise: no. Has the patient ever been transfused or tattooed?: no. The patient reports that there is not domestic violence in her life.     Patient desires to discuss the following today:  Has questions about permanent sterilization. Is currently on combined OCPs.     Menstrual History: Menarche age: 30 or 50 Patient's last menstrual period was 03/05/2023 (exact date). Period Duration (Days): 3 Period Pattern: Regular Menstrual Flow: Moderate Menstrual Control: Thin pad Menstrual Control Change Freq (Hours): 1-2 Dysmenorrhea: None     Gynecologic History:  Contraception: OCP (estrogen/progesterone) History of STI's: HPV Last Pap: 06/25/2021.  Results were:  ASCUS HR HPV neg.  Pap history significant for: 03/16/2020. Results were: abnormal, ASC-H.  S/p colposcopy in 05/2020  with CIN III on biopsy. LEEP performed 06/2020 with CIN II-III with benign EC.     Upstream - 03/21/23 0816       Pregnancy Intention Screening   Does the patient want to become pregnant in the next year? No    Does the patient's partner want to become pregnant in the next year? No    Would the patient like to discuss contraceptive options today? Yes      Contraception Wrap Up   Current Method Oral Contraceptive    End Method Oral Contraceptive    Contraception Counseling Provided No            The pregnancy intention screening data noted above was reviewed. Potential methods of contraception were discussed. The patient elected to proceed with Oral Contraceptive.   OB History  Gravida Para Term Preterm AB Living  3 2 2  0 1 2  SAB IAB Ectopic Multiple Live Births  1 0 0 0  2    # Outcome Date GA Lbr Len/2nd Weight Sex Type Anes PTL Lv  3 Term 12/01/10 [redacted]w[redacted]d  6 lb 3.2 oz (2.812 kg) F Vag-Spont  N LIV     Name: [name]  2 SAB 2010 [redacted]w[redacted]d       ND  1 Term 09/26/06 [redacted]w[redacted]d  5 lb 9.6 oz (2.54 kg) F Vag-Spont None N LIV     Name: [facility]    Obstetric Comments  2010-sab & D&C    Past Medical History:  Diagnosis Date   HPV in female 04/2015   S/P LEEP 08/12/2020    Past Surgical History:  Procedure Laterality Date   CERVICAL BIOPSY  W/ LOOP ELECTRODE EXCISION     DILATION AND CURETTAGE OF UTERUS      Family History  Problem Relation Age of Onset   Diabetes Mother    Cancer Brother    Breast cancer Neg Hx    Ovarian cancer Neg Hx    Colon cancer Neg Hx     Social History   Socioeconomic History   Marital status: Single    Spouse name: Not on file   Number of children: Not on file   Years of education: Not on file   Highest education level: Not on file  Occupational History   Not on file  Tobacco Use   Smoking status: Never   Smokeless tobacco: Never  Vaping Use  Vaping status: Never Used  Substance and Sexual Activity   Alcohol use: No   Drug use: No   Sexual activity: Not Currently    Partners: Male    Birth control/protection: Pill  Other Topics Concern   Not on file  Social History Narrative   Not on file   Social Determinants of Health   Financial Resource Strain: Low Risk  (01/11/2023)   Received from [facility]   Overall Financial Resource Strain (CARDIA)    Difficulty of Paying Living Expenses: Not hard at all  Food Insecurity: No Food Insecurity (01/11/2023)   Received from [facility]   Hunger Vital Sign    Worried About Running Out of Food in the Last Year: Never true    Ran Out of Food in the Last Year: Never true  Transportation Needs: No Transportation Needs (01/11/2023)   Received from [facility] - Transportation    In the past 12 months, has lack  of transportation kept you from medical appointments or from getting medications?: No    Lack of Transportation (Non-Medical): No  Physical Activity: Insufficiently Active (01/11/2023)   Received from [facility]   Exercise Vital Sign    Days of Exercise per Week: 3 days    Minutes of Exercise per Session: 30 min  Stress: No Stress Concern Present (01/11/2023)   Received from [facility] - Occupational Stress Questionnaire    Feeling of Stress : Not at all  Social Connections: Moderately Integrated (01/11/2023)   Received from [facility]   Social Connection and Isolation Panel [NHANES]    Frequency of Communication with Friends and Family: More than three times a week    Frequency of Social Gatherings with Friends and Family: More than three times a week    Attends Religious Services: More than 4 times per year    Active Member of [employer] or Organizations: No    Attends Banker Meetings: Never    Marital Status: Married  Catering manager Violence: Not on file    Current Outpatient Medications on File Prior to Visit  Medication Sig Dispense Refill   albuterol (VENTOLIN HFA) 108 (90 Base) MCG/ACT inhaler Inhale 2 puffs into the lungs every 4 (four) hours as needed. 6.7 g 0   citalopram (CELEXA) 10 MG tablet Take 1 tablet by mouth daily.     Norgestimate-Ethinyl Estradiol Triphasic (TRI-LO-MARZIA) 0.18/0.215/0.25 MG-25 MCG tab Take 1 tablet by mouth daily. 84 tablet 0   No current facility-administered medications on file prior to visit.    No Known Allergies   Review of Systems Constitutional: negative for chills, fatigue, fevers and sweats Eyes: negative for irritation, redness and visual disturbance Ears, nose, mouth, throat, and face: negative for hearing loss, nasal congestion, snoring and tinnitus Respiratory: negative for asthma, cough, sputum Cardiovascular: negative  for chest pain, dyspnea, exertional chest pressure/discomfort, irregular heart beat, palpitations and syncope Gastrointestinal: negative for abdominal pain, change in bowel habits, nausea and vomiting Genitourinary: negative for abnormal menstrual periods, genital lesions, sexual problems and vaginal discharge, dysuria and urinary incontinence Integument/breast: negative for breast lump, breast tenderness and nipple discharge Hematologic/lymphatic: negative for bleeding and easy bruising Musculoskeletal:negative for back pain and muscle weakness Neurological: negative for dizziness, headaches, vertigo and weakness Endocrine: negative for diabetic symptoms including polydipsia, polyuria and skin dryness Allergic/Immunologic: negative for hay fever and urticaria  Objective:  There were no vitals taken for this visit. There is no height or weight on file to calculate BMI.    General Appearance:    Alert, cooperative, no distress, appears stated age  Head:    Normocephalic, without obvious abnormality, atraumatic  Eyes:    PERRL, conjunctiva/corneas clear, EOM's intact, both eyes  Ears:    Normal external ear canals, both ears  Nose:   Nares normal, septum midline, mucosa normal, no drainage or sinus tenderness  Throat:   Lips, mucosa, and tongue normal; teeth and gums normal  Neck:   Supple, symmetrical, trachea midline, no adenopathy; thyroid: no enlargement/tenderness/nodules; no carotid bruit or JVD  Back:     Symmetric, no curvature, ROM normal, no CVA tenderness  Lungs:     Clear to auscultation bilaterally, respirations unlabored  Chest Wall:    No tenderness or deformity   Heart:    Regular rate and rhythm, S1 and S2 normal, no murmur, rub or gallop  Breast Exam:    No tenderness, masses, or nipple abnormality  Abdomen:     Soft, non-tender, bowel sounds active all four quadrants, no masses, no organomegaly.    Genitalia:    Pelvic:external genitalia normal, vagina without  lesions, discharge, or tenderness, rectovaginal septum  normal. Cervix normal in appearance, with post-LEEP changes, some granulation tissue noted at cervical os, no cervical motion tenderness, no adnexal masses or tenderness.  Uterus normal size, shape, mobile, regular contours, nontender.  Rectal:    Normal external sphincter.  No hemorrhoids appreciated. Internal exam not done.   Extremities:   Extremities normal, atraumatic, no cyanosis or edema  Pulses:   2+ and symmetric all extremities  Skin:   Skin color, texture, turgor normal, no rashes or lesions  Lymph nodes:   Cervical, supraclavicular, and axillary nodes normal  Neurologic:   CNII-XII intact, normal strength, sensation and reflexes throughout   .  Labs:  Lab Results  Component Value Date   WBC 8.5 10/26/2021   HGB 12.9 10/26/2021   HCT 41.3 10/26/2021   MCV 93.2 10/26/2021   PLT 323 10/26/2021    Lab Results  Component Value Date   CREATININE 0.89 10/26/2021   BUN 12 10/26/2021   NA 139 10/26/2021   K 3.5 10/26/2021   CL 106 10/26/2021   CO2 23 10/26/2021    Lab Results  Component Value Date   ALT 13 06/25/2021   AST 12 06/25/2021   ALKPHOS 90 06/25/2021   BILITOT <0.2 06/25/2021    Lab Results  Component Value Date   TSH 2.920 06/25/2021     Assessment:   1. Encounter for well woman exam with routine gynecological exam   2. CIN III (cervical intraepithelial neoplasia grade III) with severe dysplasia   3. Need for hepatitis C screening test   4. Encounter for screening for HIV   5. Vitamin D deficiency   6. Prediabetes   7. Encounter for counseling regarding contraception   8. Obesity, Class III, BMI 40-49.9 (morbid obesity) (HCC)      Plan:  - Blood tests: HIV, HCV screenings performed with patient's permission. Will also check Vit D due to history of deficiency. Has had other recent labs performed by PCP in August. - Breast self exam technique reviewed and patient encouraged to perform  self-exam monthly. - Contraception: OCP (estrogen/progesterone). Discussed contraception options with patient. Inquired as to if her cycles are manageable without hormonal pills, patient notes she cannot remember as she has  been on them for so long. Discussed risks benefits of BTL vs consideration of IUD with similar efficacy, less invasive, and can manage cycles if heavy. Patient notes that she is now unsure, would like to think on her options more.  Will try 1-2 months without her pills to evaluate her cycles. Will use Phexxi for now. Given handouts on other methods.  - Discussed healthy lifestyle modifications. - Mammogram  : Not age appropriate. To begin screens at age 89.  - Pap smear  up to date . Has h/o CIN III s/p LEEP in 2022. Most recent pap in 2023 neg HPV, ASCUS. If current pap negative, can return to q 3 year screens.  - Flu vaccine: declined.  - Prediabetes, currently managed by PCP, on a Semaglutide therapy.  - COVID vaccine: eligible for current season's vaccine.  - Follow up in 1 year for annual exam   Hildred Laser, MD [facility]

## 2023-03-20 NOTE — Patient Instructions (Signed)
Preventive Care 21-39 Years Old, Female Preventive care refers to lifestyle choices and visits with your health care provider that can promote health and wellness. Preventive care visits are also called wellness exams. What can I expect for my preventive care visit? Counseling During your preventive care visit, your health care provider may ask about your: Medical history, including: Past medical problems. Family medical history. Pregnancy history. Current health, including: Menstrual cycle. Method of birth control. Emotional well-being. Home life and relationship well-being. Sexual activity and sexual health. Lifestyle, including: Alcohol, nicotine or tobacco, and drug use. Access to firearms. Diet, exercise, and sleep habits. Work and work environment. Sunscreen use. Safety issues such as seatbelt and bike helmet use. Physical exam Your health care provider may check your: Height and weight. These may be used to calculate your BMI (body mass index). BMI is a measurement that tells if you are at a healthy weight. Waist circumference. This measures the distance around your waistline. This measurement also tells if you are at a healthy weight and may help predict your risk of certain diseases, such as type 2 diabetes and high blood pressure. Heart rate and blood pressure. Body temperature. Skin for abnormal spots. What immunizations do I need?  Vaccines are usually given at various ages, according to a schedule. Your health care provider will recommend vaccines for you based on your age, medical history, and lifestyle or other factors, such as travel or where you work. What tests do I need? Screening Your health care provider may recommend screening tests for certain conditions. This may include: Pelvic exam and Pap test. Lipid and cholesterol levels. Diabetes screening. This is done by checking your blood sugar (glucose) after you have not eaten for a while (fasting). Hepatitis  B test. Hepatitis C test. HIV (human immunodeficiency virus) test. STI (sexually transmitted infection) testing, if you are at risk. BRCA-related cancer screening. This may be done if you have a family history of breast, ovarian, tubal, or peritoneal cancers. Talk with your health care provider about your test results, treatment options, and if necessary, the need for more tests. Follow these instructions at home: Eating and drinking  Eat a healthy diet that includes fresh fruits and vegetables, whole grains, lean protein, and low-fat dairy products. Take vitamin and mineral supplements as recommended by your health care provider. Do not drink alcohol if: Your health care provider tells you not to drink. You are pregnant, may be pregnant, or are planning to become pregnant. If you drink alcohol: Limit how much you have to 0-1 drink a day. Know how much alcohol is in your drink. In the [place] one drink equals one 12 oz bottle of beer (355 mL), one 5 oz glass of wine (148 mL), or one 1 oz glass of hard liquor (44 mL). Lifestyle Brush your teeth every morning and night with fluoride toothpaste. Floss one time each day. Exercise for at least 30 minutes 5 or more days each week. Do not use any products that contain nicotine or tobacco. These products include cigarettes, chewing tobacco, and vaping devices, such as e-cigarettes. If you need help quitting, ask your health care provider. Do not use drugs. If you are sexually active, practice safe sex. Use a condom or other form of protection to prevent STIs. If you do not wish to become pregnant, use a form of birth control. If you plan to become pregnant, see your health care provider for a prepregnancy visit. Find healthy ways to manage stress, such as: Meditation,   yoga, or listening to music. Journaling. Talking to a trusted person. Spending time with friends and family. Minimize exposure to UV radiation to reduce your risk of skin  cancer. Safety Always wear your seat belt while driving or riding in a vehicle. Do not drive: If you have been drinking alcohol. Do not ride with someone who has been drinking. If you have been using any mind-altering substances or drugs. While texting. When you are tired or distracted. Wear a helmet and other protective equipment during sports activities. If you have firearms in your house, make sure you follow all gun safety procedures. Seek help if you have been physically or sexually abused. What's next? Go to your health care provider once a year for an annual wellness visit. Ask your health care provider how often you should have your eyes and teeth checked. Stay up to date on all vaccines. This information is not intended to replace advice given to you by your health care provider. Make sure you discuss any questions you have with your health care provider. Document Revised: 11/11/2020 Document Reviewed: 11/11/2020 Elsevier Patient Education  2024 [employer] Breast Self-Awareness Breast self-awareness is knowing how your breasts look and feel. You need to: Check your breasts on a regular basis. Tell your doctor about any changes. Become familiar with the look and feel of your breasts. This can help you catch a breast problem while it is still small and can be treated. You should do breast self-exams even if you have breast implants. What you need: A mirror. A well-lit room. A pillow or other soft object. How to do a breast self-exam Follow these steps to do a breast self-exam: Look for changes  Take off all the clothes above your waist. Stand in front of a mirror in a room with good lighting. Put your hands down at your sides. Compare your breasts in the mirror. Look for any difference between them, such as: A difference in shape. A difference in size. Wrinkles, dips, and bumps in one breast and not the other. Look at each breast for changes in the skin, such  as: Redness. Scaly areas. Skin that has gotten thicker. Dimpling. Open sores (ulcers). Look for changes in your nipples, such as: Fluid coming out of a nipple. Fluid around a nipple. Bleeding. Dimpling. Redness. A nipple that looks pushed in (retracted), or that has changed position. Feel for changes Lie on your back. Feel each breast. To do this: Pick a breast to feel. Place a pillow under the shoulder closest to that breast. Put the arm closest to that breast behind your head. Feel the nipple area of that breast using the hand of your other arm. Feel the area with the pads of your three middle fingers by making small circles with your fingers. Use light, medium, and firm pressure. Continue the overlapping circles, moving downward over the breast. Keep making circles with your fingers. Stop when you feel your ribs. Start making circles with your fingers again, this time going upward until you reach your collarbone. Then, make circles outward across your breast and into your armpit area. Squeeze your nipple. Check for discharge and lumps. Repeat these steps to check your other breast. Sit or stand in the tub or shower. With soapy water on your skin, feel each breast the same way you did when you were lying down. Write down what you find Writing down what you find can help you remember what to tell your doctor. Write down: What is   normal for each breast. Any changes you find in each breast. These include: The kind of changes you find. A tender or painful breast. Any lump you find. Write down its size and where it is. When you last had your monthly period (menstrual cycle). General tips If you are breastfeeding, the best time to check your breasts is after you feed your baby or after you use a breast pump. If you get monthly bleeding, the best time to check your breasts is 5-7 days after your monthly cycle ends. With time, you will become comfortable with the self-exam. You will  also start to know if there are changes in your breasts. Contact a doctor if: You see a change in the shape or size of your breasts or nipples. You see a change in the skin of your breast or nipples, such as red or scaly skin. You have fluid coming from your nipples that is not normal. You find a new lump or thick area. You have breast pain. You have any concerns about your breast health. Summary Breast self-awareness includes looking for changes in your breasts and feeling for changes within your breasts. You should do breast self-awareness in front of a mirror in a well-lit room. If you get monthly periods (menstrual cycles), the best time to check your breasts is 5-7 days after your period ends. Tell your doctor about any changes you see in your breasts. Changes include changes in size, changes on the skin, painful or tender breasts, or fluid from your nipples that is not normal. This information is not intended to replace advice given to you by your health care provider. Make sure you discuss any questions you have with your health care provider. Document Revised: 10/21/2021 Document Reviewed: 03/18/2021 Elsevier Patient Education  2024 [employer]  

## 2023-03-21 ENCOUNTER — Encounter: Payer: Self-pay | Admitting: Obstetrics and Gynecology

## 2023-03-21 ENCOUNTER — Other Ambulatory Visit (HOSPITAL_COMMUNITY)
Admission: RE | Admit: 2023-03-21 | Discharge: 2023-03-21 | Disposition: A | Discharge status: Home / Self Care | Payer: BLUE CROSS/BLUE SHIELD | Source: Ambulatory Visit | Attending: Obstetrics and Gynecology | Admitting: Obstetrics and Gynecology

## 2023-03-21 ENCOUNTER — Ambulatory Visit (INDEPENDENT_AMBULATORY_CARE_PROVIDER_SITE_OTHER): Payer: BLUE CROSS/BLUE SHIELD | Admitting: Obstetrics and Gynecology

## 2023-03-21 VITALS — BP 117/87 | HR 95 | Resp 15 | Ht 62.0 in | Wt 221.5 lb

## 2023-03-21 DIAGNOSIS — D069 Carcinoma in situ of cervix, unspecified: Secondary | ICD-10-CM | POA: Insufficient documentation

## 2023-03-21 DIAGNOSIS — Z114 Encounter for screening for human immunodeficiency virus [HIV]: Secondary | ICD-10-CM

## 2023-03-21 DIAGNOSIS — E559 Vitamin D deficiency, unspecified: Secondary | ICD-10-CM

## 2023-03-21 DIAGNOSIS — R7303 Prediabetes: Secondary | ICD-10-CM

## 2023-03-21 DIAGNOSIS — Z1159 Encounter for screening for other viral diseases: Secondary | ICD-10-CM

## 2023-03-21 DIAGNOSIS — E785 Hyperlipidemia, unspecified: Secondary | ICD-10-CM

## 2023-03-21 DIAGNOSIS — Z01419 Encounter for gynecological examination (general) (routine) without abnormal findings: Secondary | ICD-10-CM | POA: Diagnosis present

## 2023-03-21 DIAGNOSIS — Z3009 Encounter for other general counseling and advice on contraception: Secondary | ICD-10-CM

## 2023-03-21 DIAGNOSIS — Z6835 Body mass index (BMI) 35.0-35.9, adult: Secondary | ICD-10-CM

## 2023-03-21 DIAGNOSIS — E66813 Obesity, class 3: Secondary | ICD-10-CM

## 2023-03-21 MED ORDER — PHEXXI 1.8-1-0.4 % VA GEL: VAGINAL | 2 refills | Status: AC

## 2023-03-22 ENCOUNTER — Encounter: Payer: Self-pay | Admitting: Obstetrics and Gynecology

## 2023-03-22 ENCOUNTER — Other Ambulatory Visit: Payer: Self-pay | Admitting: Obstetrics and Gynecology

## 2023-03-22 DIAGNOSIS — E559 Vitamin D deficiency, unspecified: Secondary | ICD-10-CM

## 2023-03-22 LAB — HIV ANTIBODY (ROUTINE TESTING W REFLEX): HIV Screen 4th Generation wRfx: NONREACTIVE

## 2023-03-22 LAB — HEPATITIS C ANTIBODY: Hep C Virus Ab: NONREACTIVE

## 2023-03-22 LAB — VITAMIN D 25 HYDROXY (VIT D DEFICIENCY, FRACTURES): Vit D, 25-Hydroxy: 11.1 ng/mL — ABNORMAL LOW (ref 30.0–100.0)

## 2023-03-22 MED ORDER — VITAMIN D (ERGOCALCIFEROL) 1.25 MG (50000 UNIT) PO CAPS: 50000.0000 [IU] | ORAL_CAPSULE | ORAL | 1 refills | Status: AC

## 2023-03-28 LAB — CYTOLOGY - PAP: Diagnosis: NEGATIVE

## 2023-05-13 ENCOUNTER — Other Ambulatory Visit: Payer: Self-pay | Admitting: Obstetrics and Gynecology

## 2023-05-16 ENCOUNTER — Other Ambulatory Visit: Payer: Self-pay | Admitting: Obstetrics and Gynecology

## 2023-05-16 ENCOUNTER — Encounter: Payer: Self-pay | Admitting: Obstetrics and Gynecology

## 2023-05-16 MED ORDER — NORGESTIM-ETH ESTRAD TRIPHASIC 0.18/0.215/0.25 MG-25 MCG PO TABS: 1.0000 | ORAL_TABLET | Freq: Every day | ORAL | 3 refills | Status: DC

## 2023-06-26 IMAGING — CT CT ANGIO CHEST
2 of 6 series · 18 of 46 positions shown · IV contrast (APPLIED)
Comparison: 10/26/2021

CLINICAL DATA: Elevated D-dimer, short of breath for 3 days

EXAM:
CT ANGIOGRAPHY CHEST WITH CONTRAST
TECHNIQUE: Multidetector CT imaging of the chest was performed using the
standard protocol during bolus administration of intravenous
contrast. Multiplanar CT image reconstructions and MIPs were
obtained to evaluate the vascular anatomy.

[Series 6: thins · axial · 0.70mm/px · z∈[-613,-378]mm · 15 of 368 slices shown]
[im 16/368  lung]
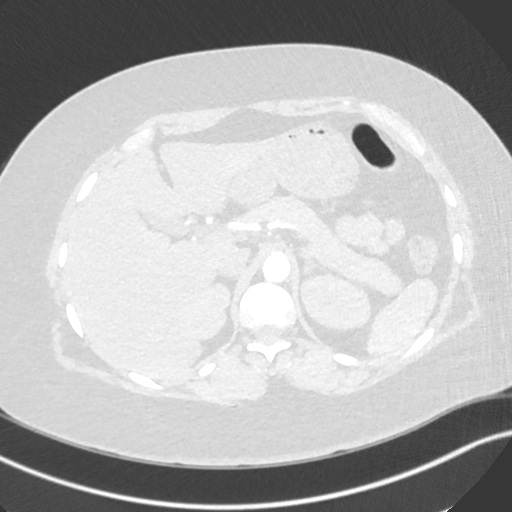
[im 48/368  soft-tissue]
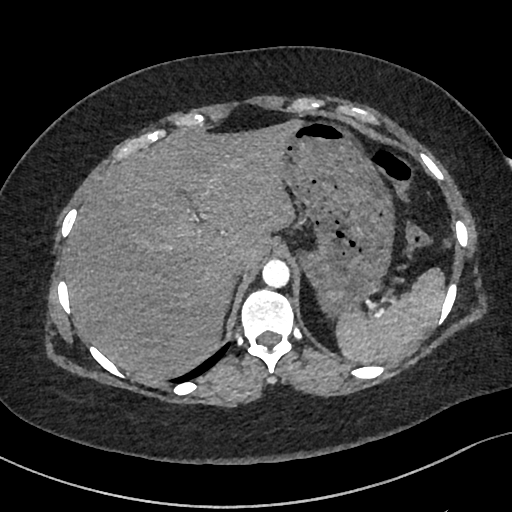
[im 64/368  lung]
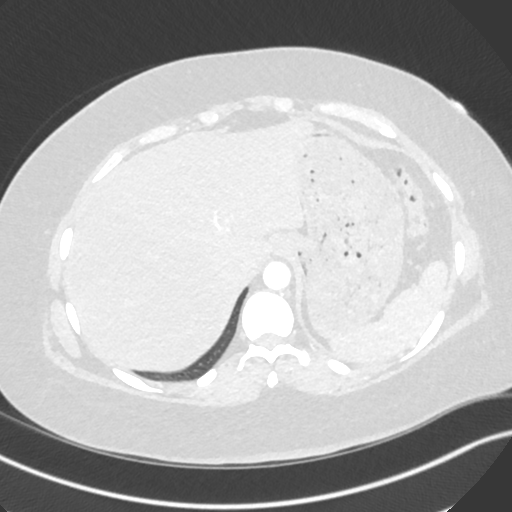
[im 96/368  soft-tissue]
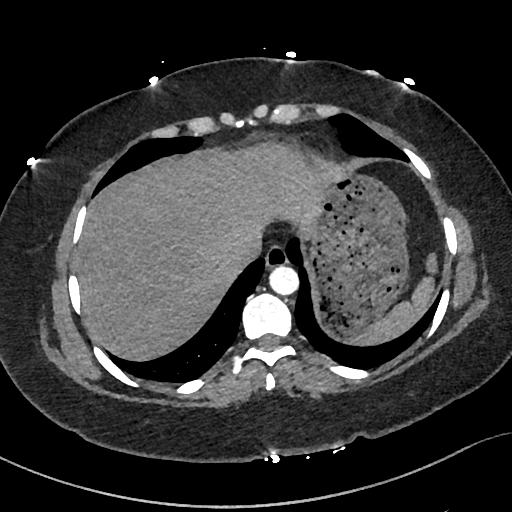
[im 112/368  lung]
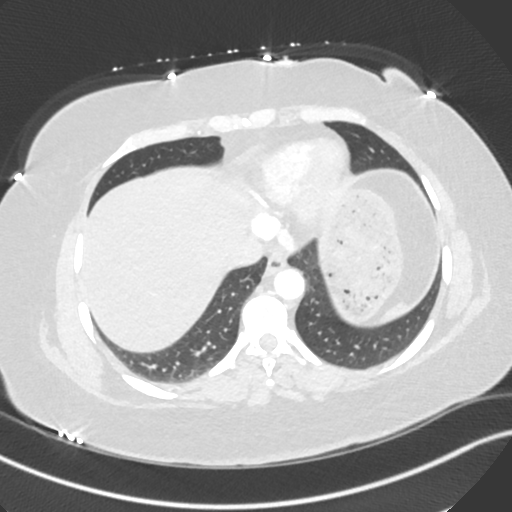
[im 144/368  soft-tissue]
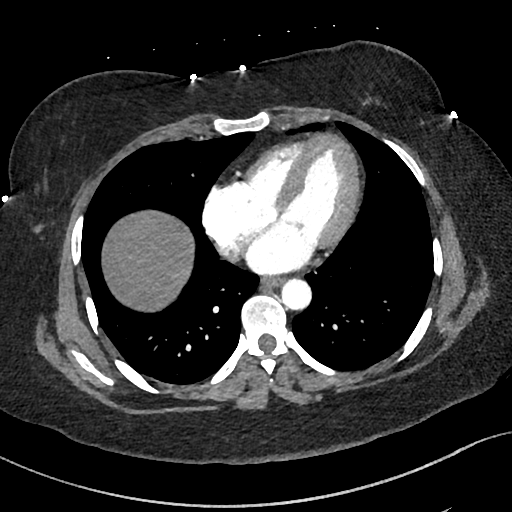
[im 160/368  lung]
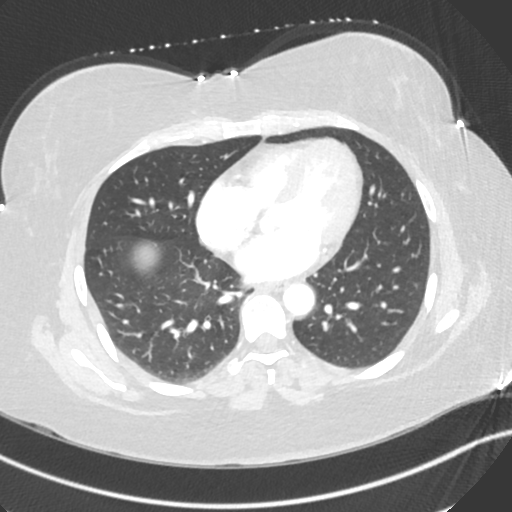
[im 192/368  soft-tissue]
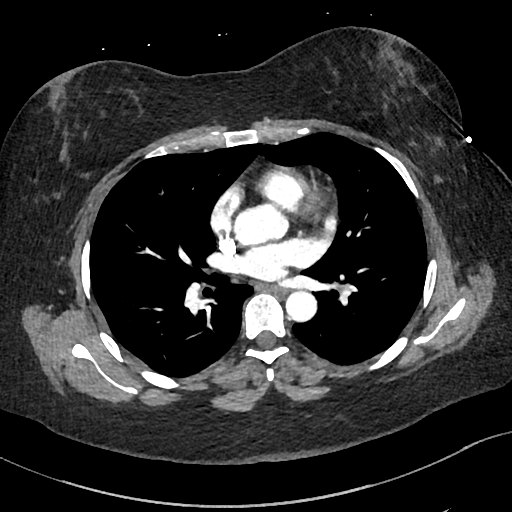
[im 208/368  lung]
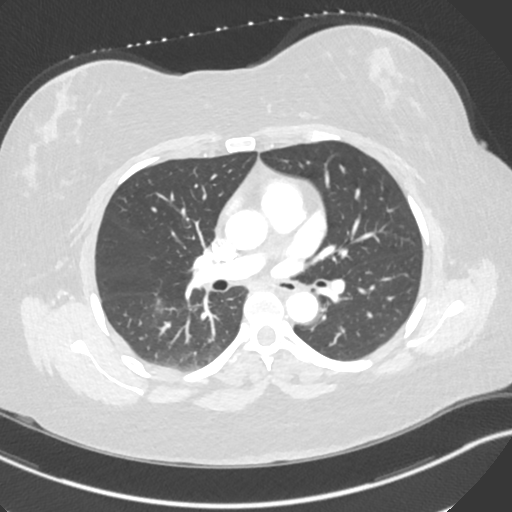
[im 224/368  soft-tissue]
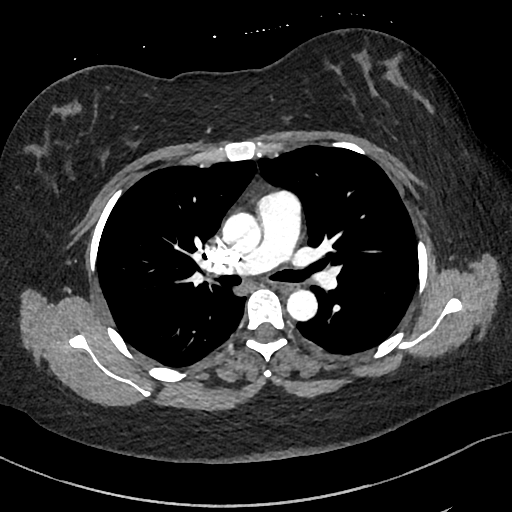
[im 256/368  lung]
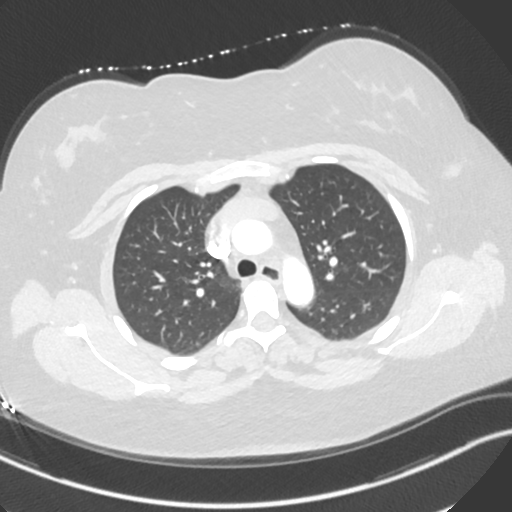
[im 272/368  soft-tissue]
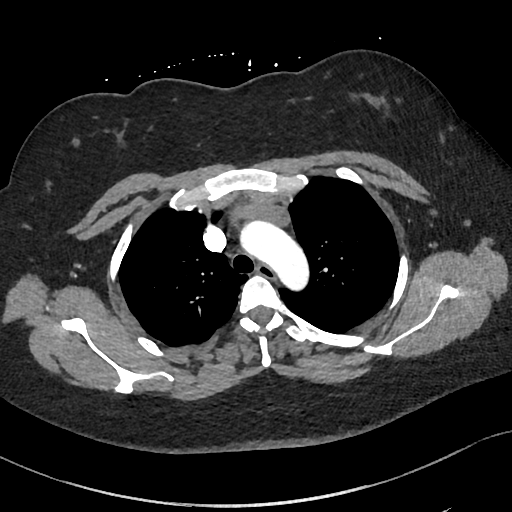
[im 304/368  lung]
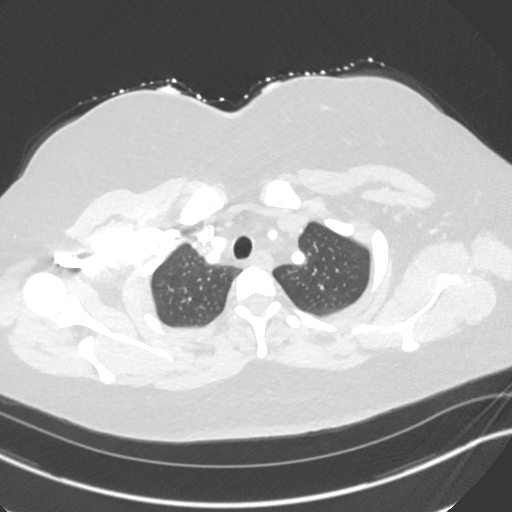
[im 320/368  soft-tissue]
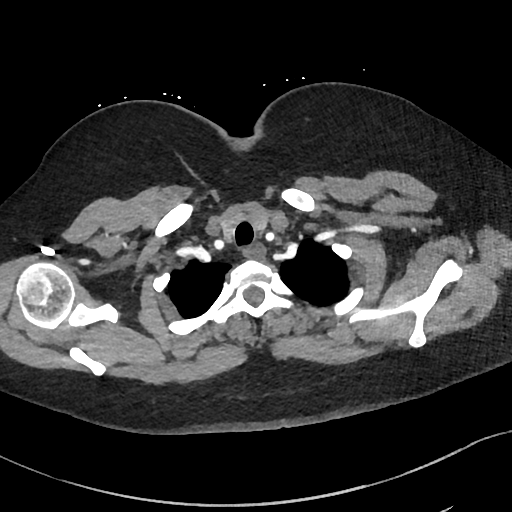
[im 352/368  lung]
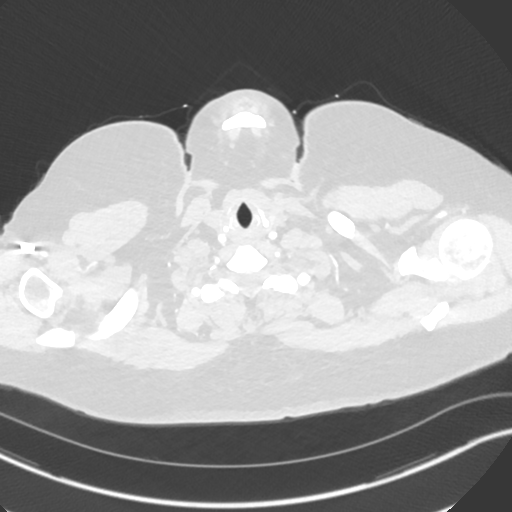

[Series 7: cor · coronal · 0.52mm/px · 3 of 135 slices shown]
[im 34/135  soft-tissue]
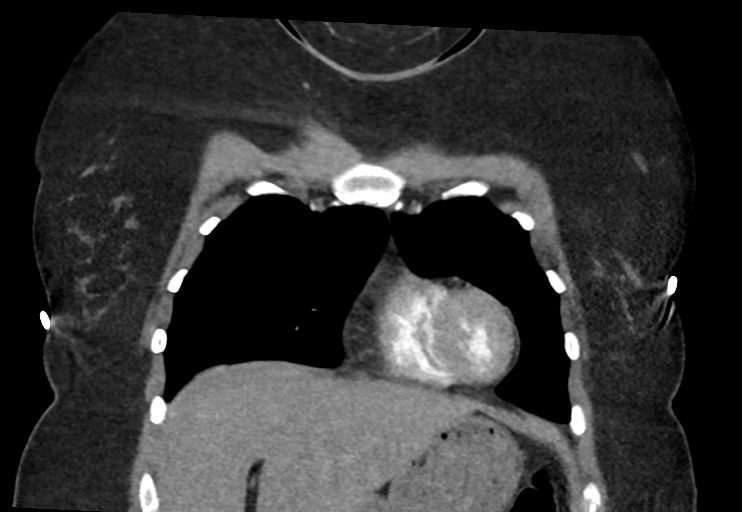
[im 68/135  soft-tissue]
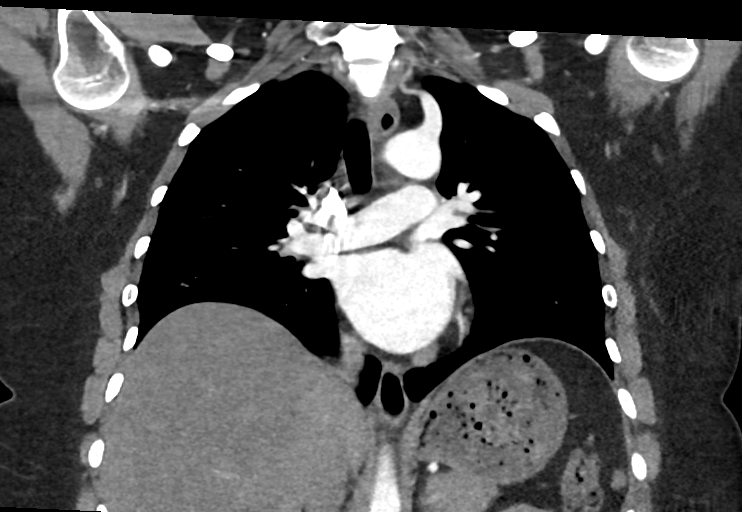
[im 101/135  soft-tissue]
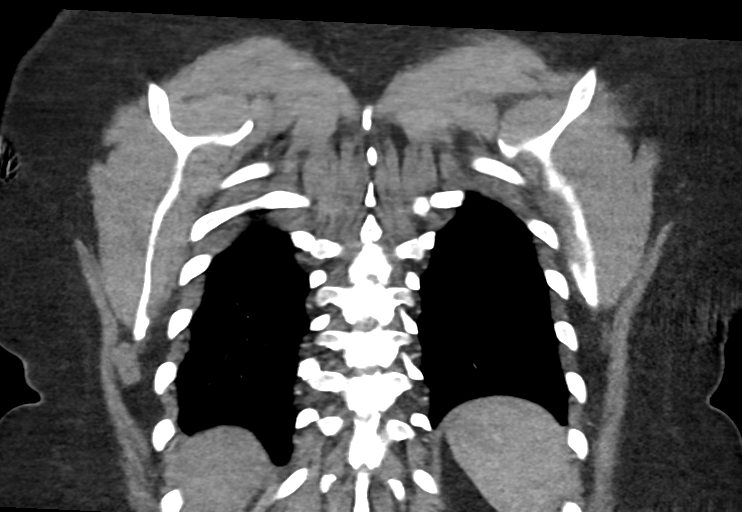

[18 of 46 positions shown; findings below may reference images not displayed]

RADIATION DOSE REDUCTION: This exam was performed according to the
departmental dose-optimization program which includes automated
exposure control, adjustment of the mA and/or kV according to
patient size and/or use of iterative reconstruction technique.

CONTRAST:  75mL OMNIPAQUE IOHEXOL 350 MG/ML SOLN
FINDINGS: Cardiovascular: This is a technically adequate evaluation of the
pulmonary vasculature. No filling defects or pulmonary emboli.

The heart is unremarkable without pericardial effusion. No evidence
of thoracic aortic aneurysm or dissection.

Mediastinum/Nodes: No enlarged mediastinal, hilar, or axillary lymph
nodes. Thyroid gland, trachea, and esophagus demonstrate no
significant findings.

Lungs/Pleura: No acute airspace disease, effusion, or pneumothorax.
Central airways are widely patent.

Upper Abdomen: No acute abnormality.

Musculoskeletal: No acute or destructive bony lesions. Reconstructed
images demonstrate no additional findings.

Review of the MIP images confirms the above findings.
IMPRESSION: 1. No acute intrathoracic process.
2. No evidence of pulmonary embolus.

## 2024-05-10 ENCOUNTER — Telehealth: Payer: Self-pay

## 2024-05-10 DIAGNOSIS — Z3041 Encounter for surveillance of contraceptive pills: Secondary | ICD-10-CM

## 2024-05-10 MED ORDER — NORGESTIM-ETH ESTRAD TRIPHASIC 0.18/0.215/0.25 MG-25 MCG PO TABS: 1.0000 | ORAL_TABLET | Freq: Every day | ORAL | 0 refills | Status: AC
